# Patient Record
Sex: Female | Born: 1975 | Race: White | Hispanic: Yes | Marital: Single | Smoking: Never smoker
Health system: Southern US, Community
[De-identification: ages and names within clinical notes are randomized; demographics above are authoritative.]

## PROBLEM LIST (undated history)

## (undated) ENCOUNTER — Emergency Department (HOSPITAL_COMMUNITY): Discharge status: Absent without leave | Payer: Self-pay

## (undated) DIAGNOSIS — O24419 Gestational diabetes mellitus in pregnancy, unspecified control: Secondary | ICD-10-CM

## (undated) DIAGNOSIS — Z973 Presence of spectacles and contact lenses: Secondary | ICD-10-CM

## (undated) HISTORY — PX: TUBAL LIGATION: SHX77

## (undated) HISTORY — DX: Gestational diabetes mellitus in pregnancy, unspecified control: O24.419

---
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
## 1998-09-03 ENCOUNTER — Other Ambulatory Visit: Admission: RE | Admit: 1998-09-03 | Discharge: 1998-09-03 | Payer: Self-pay | Admitting: Gynecology

## 1999-01-02 ENCOUNTER — Inpatient Hospital Stay (HOSPITAL_COMMUNITY): Admission: AD | Admit: 1999-01-02 | Discharge: 1999-01-02 | Payer: Self-pay | Admitting: Obstetrics and Gynecology

## 1999-01-02 ENCOUNTER — Encounter: Admission: RE | Admit: 1999-01-02 | Discharge: 1999-04-02 | Payer: Self-pay | Admitting: Gynecology

## 1999-03-19 ENCOUNTER — Inpatient Hospital Stay (HOSPITAL_COMMUNITY): Admission: AD | Admit: 1999-03-19 | Discharge: 1999-03-21 | Payer: Self-pay | Admitting: Gynecology

## 1999-05-06 ENCOUNTER — Other Ambulatory Visit: Admission: RE | Admit: 1999-05-06 | Discharge: 1999-05-06 | Payer: Self-pay | Admitting: Gynecology

## 2001-02-13 ENCOUNTER — Emergency Department (HOSPITAL_COMMUNITY): Admission: EM | Admit: 2001-02-13 | Discharge: 2001-02-13 | Payer: Self-pay | Admitting: *Deleted

## 2002-03-16 ENCOUNTER — Encounter: Admission: RE | Admit: 2002-03-16 | Discharge: 2002-03-16 | Payer: Self-pay | Admitting: Family Medicine

## 2002-03-23 ENCOUNTER — Encounter: Admission: RE | Admit: 2002-03-23 | Discharge: 2002-03-23 | Payer: Self-pay | Admitting: Family Medicine

## 2002-04-23 ENCOUNTER — Encounter: Admission: RE | Admit: 2002-04-23 | Discharge: 2002-04-23 | Payer: Self-pay | Admitting: Family Medicine

## 2002-05-14 ENCOUNTER — Ambulatory Visit (HOSPITAL_COMMUNITY): Admission: RE | Admit: 2002-05-14 | Discharge: 2002-05-14 | Payer: Self-pay | Admitting: Family Medicine

## 2002-05-24 ENCOUNTER — Encounter: Admission: RE | Admit: 2002-05-24 | Discharge: 2002-05-24 | Payer: Self-pay | Admitting: Family Medicine

## 2002-05-25 ENCOUNTER — Encounter: Admission: RE | Admit: 2002-05-25 | Discharge: 2002-05-25 | Payer: Self-pay | Admitting: Family Medicine

## 2002-06-08 ENCOUNTER — Encounter: Admission: RE | Admit: 2002-06-08 | Discharge: 2002-06-08 | Payer: Self-pay | Admitting: Family Medicine

## 2002-06-15 ENCOUNTER — Encounter: Admission: RE | Admit: 2002-06-15 | Discharge: 2002-09-13 | Payer: Self-pay | Admitting: *Deleted

## 2002-06-20 ENCOUNTER — Encounter: Admission: RE | Admit: 2002-06-20 | Discharge: 2002-06-20 | Payer: Self-pay | Admitting: Family Medicine

## 2002-06-27 ENCOUNTER — Ambulatory Visit (HOSPITAL_COMMUNITY): Admission: RE | Admit: 2002-06-27 | Discharge: 2002-06-27 | Payer: Self-pay | Admitting: Family Medicine

## 2002-07-04 ENCOUNTER — Encounter: Admission: RE | Admit: 2002-07-04 | Discharge: 2002-07-04 | Payer: Self-pay | Admitting: Family Medicine

## 2002-07-18 ENCOUNTER — Encounter: Admission: RE | Admit: 2002-07-18 | Discharge: 2002-07-18 | Payer: Self-pay | Admitting: Family Medicine

## 2002-07-20 ENCOUNTER — Encounter: Admission: RE | Admit: 2002-07-20 | Discharge: 2002-07-20 | Payer: Self-pay | Admitting: Family Medicine

## 2002-07-27 ENCOUNTER — Encounter: Admission: RE | Admit: 2002-07-27 | Discharge: 2002-07-27 | Payer: Self-pay | Admitting: Family Medicine

## 2002-07-30 ENCOUNTER — Encounter: Admission: RE | Admit: 2002-07-30 | Discharge: 2002-07-30 | Payer: Self-pay | Admitting: Family Medicine

## 2002-08-13 ENCOUNTER — Encounter: Admission: RE | Admit: 2002-08-13 | Discharge: 2002-08-13 | Payer: Self-pay | Admitting: Sports Medicine

## 2002-08-13 ENCOUNTER — Ambulatory Visit (HOSPITAL_COMMUNITY): Admission: RE | Admit: 2002-08-13 | Discharge: 2002-08-13 | Payer: Self-pay | Admitting: Family Medicine

## 2002-08-27 ENCOUNTER — Encounter: Admission: RE | Admit: 2002-08-27 | Discharge: 2002-08-27 | Payer: Self-pay | Admitting: Family Medicine

## 2002-09-10 ENCOUNTER — Encounter: Admission: RE | Admit: 2002-09-10 | Discharge: 2002-09-10 | Payer: Self-pay | Admitting: Family Medicine

## 2002-09-21 ENCOUNTER — Encounter: Admission: RE | Admit: 2002-09-21 | Discharge: 2002-09-21 | Payer: Self-pay | Admitting: Family Medicine

## 2002-09-28 ENCOUNTER — Encounter: Admission: RE | Admit: 2002-09-28 | Discharge: 2002-09-28 | Payer: Self-pay | Admitting: Family Medicine

## 2002-09-30 ENCOUNTER — Inpatient Hospital Stay (HOSPITAL_COMMUNITY): Admission: AD | Admit: 2002-09-30 | Discharge: 2002-10-02 | Payer: Self-pay | Admitting: *Deleted

## 2002-10-03 ENCOUNTER — Encounter: Admission: RE | Admit: 2002-10-03 | Discharge: 2002-11-02 | Payer: Self-pay | Admitting: *Deleted

## 2002-11-20 ENCOUNTER — Encounter: Admission: RE | Admit: 2002-11-20 | Discharge: 2002-11-20 | Payer: Self-pay | Admitting: Family Medicine

## 2003-03-28 ENCOUNTER — Encounter: Admission: RE | Admit: 2003-03-28 | Discharge: 2003-03-28 | Payer: Self-pay | Admitting: Family Medicine

## 2003-05-17 ENCOUNTER — Encounter: Admission: RE | Admit: 2003-05-17 | Discharge: 2003-05-17 | Payer: Self-pay | Admitting: Family Medicine

## 2004-07-28 ENCOUNTER — Emergency Department (HOSPITAL_COMMUNITY): Admission: EM | Admit: 2004-07-28 | Discharge: 2004-07-28 | Payer: Self-pay | Admitting: Emergency Medicine

## 2004-12-14 ENCOUNTER — Ambulatory Visit: Payer: Self-pay | Admitting: Family Medicine

## 2005-03-11 ENCOUNTER — Encounter (INDEPENDENT_AMBULATORY_CARE_PROVIDER_SITE_OTHER): Payer: Self-pay | Admitting: *Deleted

## 2005-03-11 LAB — CONVERTED CEMR LAB

## 2005-06-23 ENCOUNTER — Ambulatory Visit: Payer: Self-pay | Admitting: Family Medicine

## 2005-07-16 ENCOUNTER — Ambulatory Visit: Payer: Self-pay | Admitting: Family Medicine

## 2006-12-08 DIAGNOSIS — E669 Obesity, unspecified: Secondary | ICD-10-CM | POA: Insufficient documentation

## 2006-12-09 ENCOUNTER — Encounter (INDEPENDENT_AMBULATORY_CARE_PROVIDER_SITE_OTHER): Payer: Self-pay | Admitting: *Deleted

## 2007-01-09 ENCOUNTER — Ambulatory Visit: Payer: Self-pay | Admitting: Family Medicine

## 2007-01-17 ENCOUNTER — Ambulatory Visit: Payer: Self-pay | Admitting: Family Medicine

## 2007-01-17 ENCOUNTER — Encounter: Payer: Self-pay | Admitting: Family Medicine

## 2007-01-17 LAB — CONVERTED CEMR LAB
AST: 14 units/L (ref 0–37)
BUN: 15 mg/dL (ref 6–23)
Calcium: 9.4 mg/dL (ref 8.4–10.5)
Chloride: 105 meq/L (ref 96–112)
Creatinine, Ser: 0.84 mg/dL (ref 0.40–1.20)
Glucose, Bld: 93 mg/dL (ref 70–99)
HCT: 38.8 %
HDL: 43 mg/dL (ref >39)
Hemoglobin: 13.8 g/dL
RBC: 4.1 M/uL
TSH: 2.415 microintl units/mL (ref 0.350–5.50)
Total CHOL/HDL Ratio: 3.6
Triglycerides: 83 mg/dL (ref <150)
WBC: 7.7 10*3/uL

## 2007-02-28 ENCOUNTER — Ambulatory Visit: Payer: Self-pay | Admitting: Family Medicine

## 2007-04-10 ENCOUNTER — Ambulatory Visit: Payer: Self-pay | Admitting: Sports Medicine

## 2007-04-10 ENCOUNTER — Encounter: Payer: Self-pay | Admitting: Family Medicine

## 2007-04-10 LAB — CONVERTED CEMR LAB
Antibody Screen: NEGATIVE
Basophils Relative: 0 % (ref 0–1)
Eosinophils Absolute: 0.3 10*3/uL (ref 0.0–0.7)
Eosinophils Relative: 4 % (ref 0–5)
HCT: 38.1 % (ref 36.0–46.0)
Hemoglobin: 12.4 g/dL (ref 12.0–15.0)
Lymphs Abs: 2.3 10*3/uL (ref 0.7–3.3)
MCHC: 32.5 g/dL (ref 30.0–36.0)
MCV: 97.7 fL (ref 78.0–100.0)
Monocytes Absolute: 0.6 10*3/uL (ref 0.2–0.7)
Monocytes Relative: 6 % (ref 3–11)
Neutrophils Relative %: 67 % (ref 43–77)
RBC: 3.9 M/uL (ref 3.87–5.11)
Rh Type: POSITIVE

## 2007-05-01 ENCOUNTER — Encounter: Payer: Self-pay | Admitting: Family Medicine

## 2007-05-01 ENCOUNTER — Other Ambulatory Visit: Admission: RE | Admit: 2007-05-01 | Discharge: 2007-05-01 | Payer: Self-pay | Admitting: Family Medicine

## 2007-05-01 ENCOUNTER — Ambulatory Visit: Payer: Self-pay | Admitting: Family Medicine

## 2007-05-01 LAB — CONVERTED CEMR LAB: GC Probe Amp, Genital: NEGATIVE

## 2007-06-01 ENCOUNTER — Ambulatory Visit: Payer: Self-pay | Admitting: Family Medicine

## 2007-06-05 ENCOUNTER — Telehealth (INDEPENDENT_AMBULATORY_CARE_PROVIDER_SITE_OTHER): Payer: Self-pay | Admitting: *Deleted

## 2007-06-05 ENCOUNTER — Ambulatory Visit (HOSPITAL_COMMUNITY): Admission: RE | Admit: 2007-06-05 | Discharge: 2007-06-05 | Payer: Self-pay | Admitting: Family Medicine

## 2007-06-06 ENCOUNTER — Ambulatory Visit: Payer: Self-pay | Admitting: Family Medicine

## 2007-06-14 ENCOUNTER — Encounter: Payer: Self-pay | Admitting: Family Medicine

## 2007-06-16 ENCOUNTER — Ambulatory Visit: Payer: Self-pay | Admitting: Family Medicine

## 2007-06-16 ENCOUNTER — Encounter: Payer: Self-pay | Admitting: Family Medicine

## 2007-06-19 ENCOUNTER — Ambulatory Visit (HOSPITAL_COMMUNITY): Admission: RE | Admit: 2007-06-19 | Discharge: 2007-06-19 | Payer: Self-pay | Admitting: Sports Medicine

## 2007-06-19 ENCOUNTER — Encounter: Payer: Self-pay | Admitting: Family Medicine

## 2007-07-06 ENCOUNTER — Ambulatory Visit: Payer: Self-pay | Admitting: Sports Medicine

## 2007-08-01 ENCOUNTER — Ambulatory Visit: Payer: Self-pay | Admitting: Family Medicine

## 2007-08-04 ENCOUNTER — Encounter: Payer: Self-pay | Admitting: Family Medicine

## 2007-08-04 ENCOUNTER — Ambulatory Visit: Payer: Self-pay | Admitting: Family Medicine

## 2007-08-07 ENCOUNTER — Encounter: Payer: Self-pay | Admitting: Family Medicine

## 2007-08-15 ENCOUNTER — Ambulatory Visit: Payer: Self-pay | Admitting: Family Medicine

## 2007-08-30 ENCOUNTER — Ambulatory Visit: Payer: Self-pay | Admitting: Family Medicine

## 2007-09-15 ENCOUNTER — Ambulatory Visit: Payer: Self-pay | Admitting: Family Medicine

## 2007-09-28 ENCOUNTER — Ambulatory Visit: Payer: Self-pay | Admitting: Family Medicine

## 2007-10-10 ENCOUNTER — Encounter: Payer: Self-pay | Admitting: Family Medicine

## 2007-10-10 ENCOUNTER — Ambulatory Visit: Payer: Self-pay | Admitting: Family Medicine

## 2007-10-10 LAB — CONVERTED CEMR LAB: Chlamydia, DNA Probe: NEGATIVE

## 2007-10-11 ENCOUNTER — Encounter: Payer: Self-pay | Admitting: Family Medicine

## 2007-10-12 HISTORY — PX: TUBAL LIGATION: SHX77

## 2007-10-18 ENCOUNTER — Ambulatory Visit: Payer: Self-pay | Admitting: Family Medicine

## 2007-10-22 ENCOUNTER — Inpatient Hospital Stay (HOSPITAL_COMMUNITY): Admission: AD | Admit: 2007-10-22 | Discharge: 2007-10-22 | Payer: Self-pay | Admitting: Gynecology

## 2007-10-22 ENCOUNTER — Inpatient Hospital Stay (HOSPITAL_COMMUNITY): Admission: AD | Admit: 2007-10-22 | Discharge: 2007-10-24 | Payer: Self-pay | Admitting: Gynecology

## 2007-10-22 ENCOUNTER — Ambulatory Visit: Payer: Self-pay | Admitting: Obstetrics and Gynecology

## 2007-11-07 ENCOUNTER — Telehealth: Payer: Self-pay | Admitting: *Deleted

## 2007-11-18 IMAGING — US US OB COMP +14 WK
1 series · 5 of 5 positions shown · non-contrast
Comparison: none

OBSTETRICAL ULTRASOUND:

 This ultrasound exam was performed in the [HOSPITAL] Ultrasound Department.  The OB US report was generated in the AS system, and faxed to the ordering physician.  This report is also available in [REDACTED] PACS.

[Series 1: us ob comp +14 wk · 0.20mm/px · 5 of 5 slices shown]
[im 1/5]
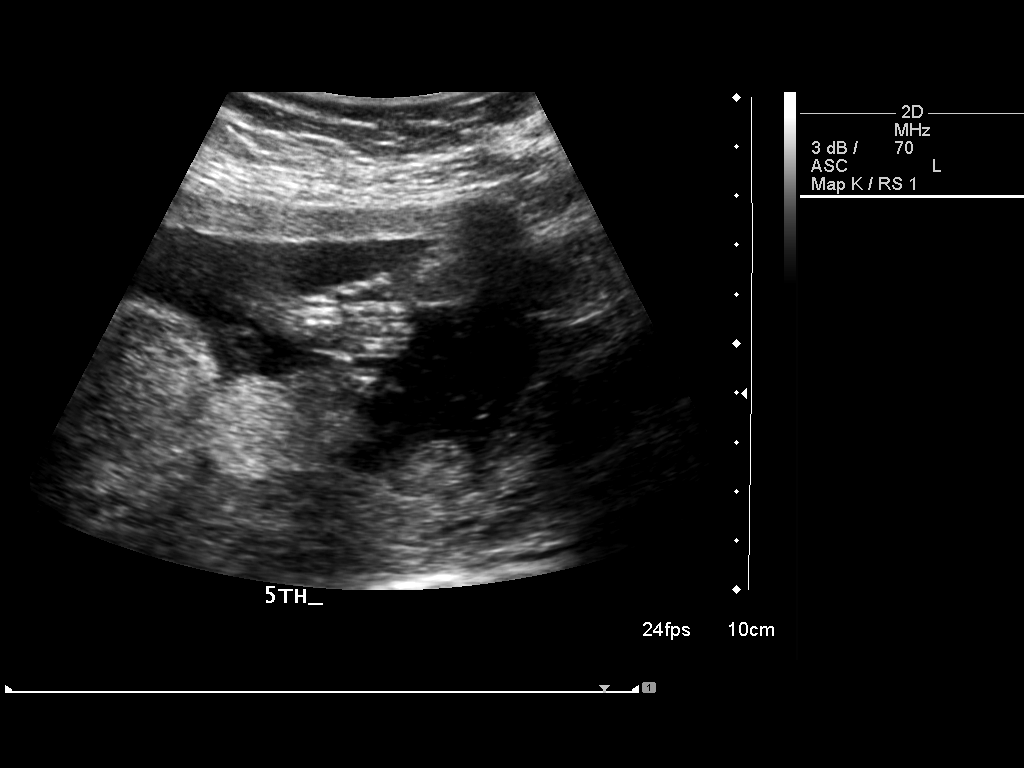
[im 2/5]
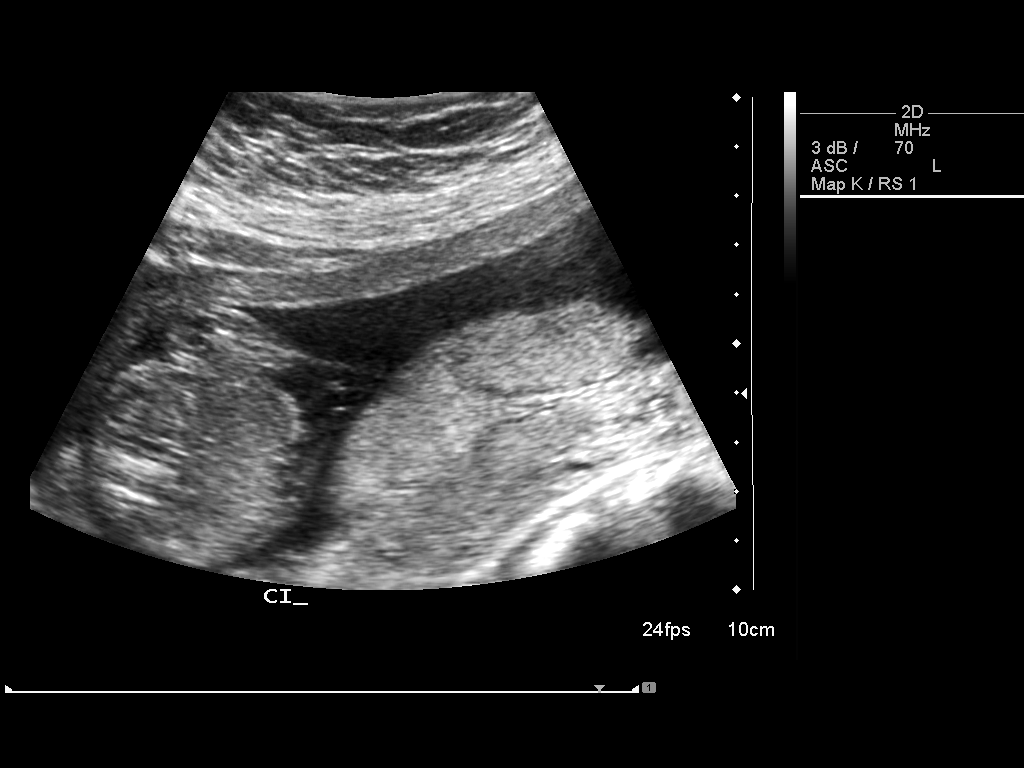
[im 3/5]
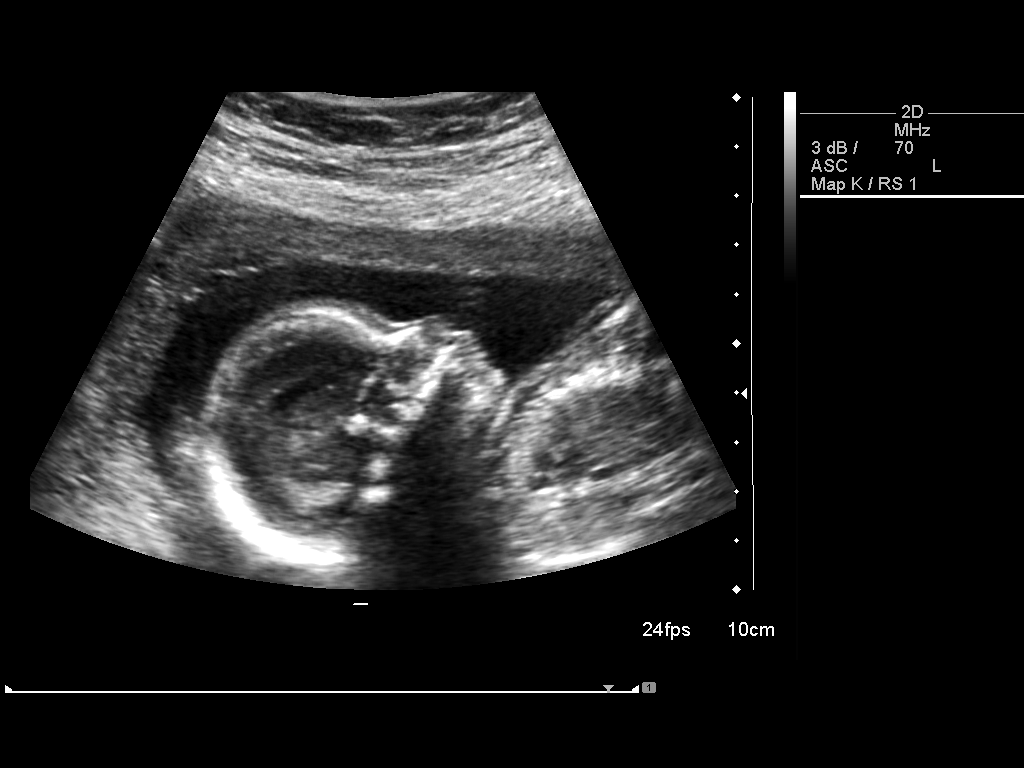
[im 4/5]
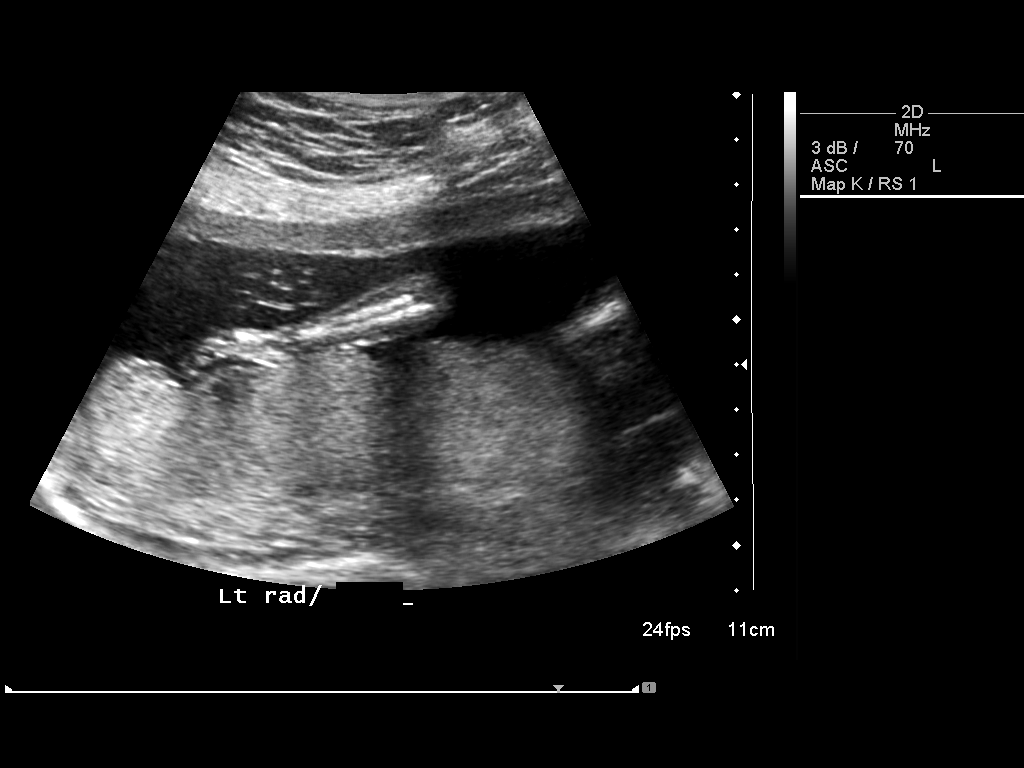
[im 5/5]
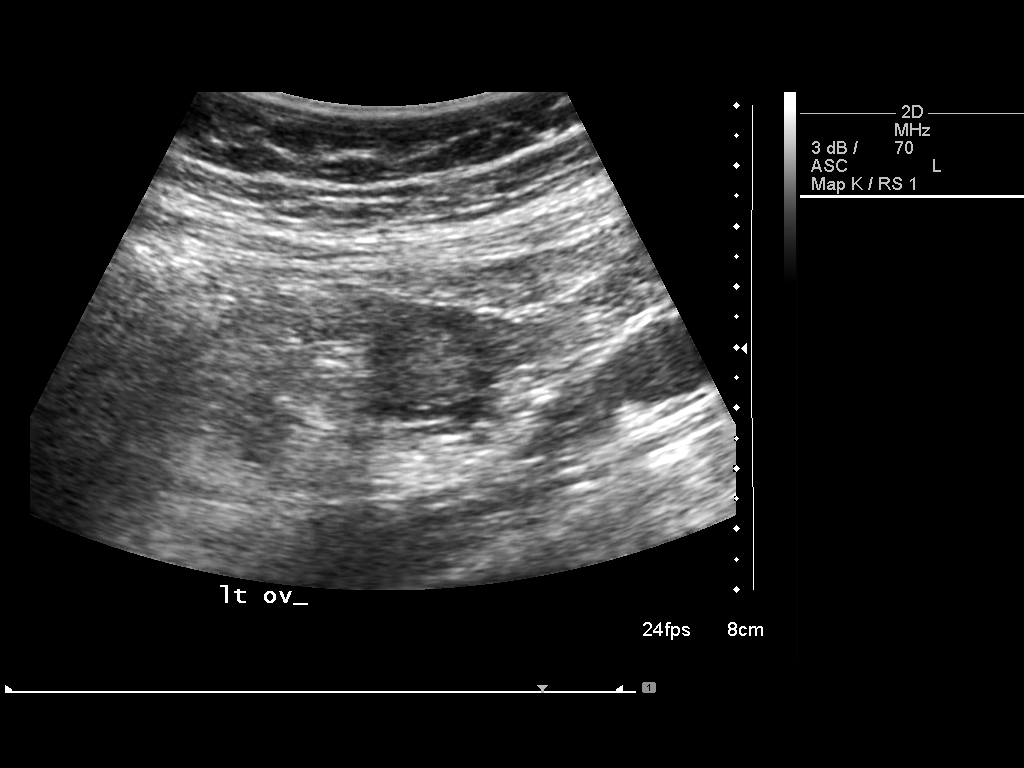

[5 of 5 positions shown; findings below may reference images not displayed]

IMPRESSION: See AS Obstetric US report.

## 2007-12-04 ENCOUNTER — Ambulatory Visit: Payer: Self-pay | Admitting: Sports Medicine

## 2008-01-17 ENCOUNTER — Encounter: Payer: Self-pay | Admitting: Family Medicine

## 2008-02-05 ENCOUNTER — Ambulatory Visit: Payer: Self-pay | Admitting: Family Medicine

## 2008-04-15 ENCOUNTER — Encounter: Payer: Self-pay | Admitting: Family Medicine

## 2008-04-15 ENCOUNTER — Ambulatory Visit: Payer: Self-pay | Admitting: Family Medicine

## 2008-04-19 ENCOUNTER — Encounter: Payer: Self-pay | Admitting: Family Medicine

## 2008-04-19 LAB — CONVERTED CEMR LAB: Pap Smear: NORMAL

## 2008-10-28 ENCOUNTER — Ambulatory Visit: Payer: Self-pay | Admitting: Family Medicine

## 2010-05-19 ENCOUNTER — Ambulatory Visit: Payer: Self-pay | Admitting: Family Medicine

## 2010-05-19 ENCOUNTER — Encounter: Payer: Self-pay | Admitting: Family Medicine

## 2010-05-19 LAB — CONVERTED CEMR LAB
Albumin: 4.4 g/dL (ref 3.5–5.2)
Alkaline Phosphatase: 91 units/L (ref 39–117)
BUN: 17 mg/dL (ref 6–23)
CO2: 22 meq/L (ref 19–32)
Glucose, Bld: 93 mg/dL (ref 70–99)
Hemoglobin: 13.3 g/dL (ref 12.0–15.0)
MCHC: 33.2 g/dL (ref 30.0–36.0)
MCV: 98 fL (ref 78.0–100.0)
Potassium: 4.1 meq/L (ref 3.5–5.3)
RBC: 4.09 M/uL (ref 3.87–5.11)
Sodium: 137 meq/L (ref 135–145)
Total Bilirubin: 0.6 mg/dL (ref 0.3–1.2)
Total Protein: 7.3 g/dL (ref 6.0–8.3)
WBC: 7.4 10*3/uL (ref 4.0–10.5)

## 2010-05-25 ENCOUNTER — Encounter: Payer: Self-pay | Admitting: Family Medicine

## 2010-07-16 ENCOUNTER — Ambulatory Visit: Payer: Self-pay | Admitting: Family Medicine

## 2010-09-16 ENCOUNTER — Ambulatory Visit: Payer: Self-pay | Admitting: Family Medicine

## 2010-11-12 NOTE — Assessment & Plan Note (Signed)
Summary: physical/pap/bmc   Vital Signs:  Patient profile:   35 year old female Height:      65 inches Weight:      186 pounds BMI:     31.06 Pulse rate:   64 / minute BP sitting:   109 / 76  (left arm)  Vitals Entered By: Arlyss Repress CMA, 06-05-10 9:04 AM) CC: physical with pap. meet new doctor. Is Patient Diabetic? No Pain Assessment Patient in pain? no        Primary Care Provider:  Cat Ta MD  CC:  physical with pap. meet new doctor.Marland Kitchen  History of Present Illness: 35 y/o F here for complete physical   Contraception: BTL 03/08/08  GYN: LMP Julny 12, usually lasts 4-5 days.          Habits & Providers  Alcohol-Tobacco-Diet     Alcohol drinks/day: 0     Tobacco Status: never  Exercise-Depression-Behavior     Does Patient Exercise: no     Exercise Counseling: to improve exercise regimen     STD Risk: current     STD Risk Counseling: to avoid increased STD risk     Contraception Counseling: questions answered     Drug Use: never     Seat Belt Use: always  Current Medications (verified): 1)  None  Allergies (verified): No Known Drug Allergies  Past History:  Past Medical History: Last updated: 05/01/2007 G3P3003, gestational diabetes 648.8, h/o preeclampsia  Family History: Last updated: 06-05-10 Mother died at 24 CAD No breast cancer Mat grandma died in her 68s of GYN cancer  Risk Factors: Alcohol Use: 0 (June 05, 2010) Exercise: no (06/05/2010)  Risk Factors: Smoking Status: never (2010-06-05)  Past Surgical History: -Unilateral salpingoophorectomy for ovarian cyst -BTL Mar 08, 2008  Family History: Mother died at 55 CAD No breast cancer Mat grandma died in her 74s of GYN cancer  Social History: Originally from [place], divorced prior husband (has two sons from that marriage) Past husband from [place] (has a son '03 from that marriage) Now cohabiting with FOB, [place]. Children [facility] [place], Miquel Leanor Rubenstein Stay at home mother.  Denies tobacco, etoh or illicit drugs use.Does Patient Exercise:  no Seat Belt Use:  always Drug Use:  never STD Risk:  current  Review of Systems General:  Denies chills and fever. Eyes:  Denies blurring. CV:  Denies chest pain or discomfort and difficulty breathing at night. Resp:  Denies cough, shortness of breath, and wheezing. GI:  Denies abdominal pain, bloody stools, and change in bowel habits. GU:  Denies abnormal vaginal bleeding, discharge, and dysuria. MS:  Denies joint pain, joint redness, and joint swelling.  Physical Exam  General:  Well-developed,well-nourished,in no acute distress; alert,appropriate and cooperative throughout examination. vitals reviewed.  Head:  normocephalic and atraumatic.   Eyes:  No corneal or conjunctival inflammation noted. EOMI. Perrla. Ears:  External ear exam shows no significant lesions or deformities.  Otoscopic examination reveals clear canals, tympanic membranes are intact bilaterally without bulging, retraction, inflammation or discharge. Hearing is grossly normal bilaterally. Mouth:  Oral mucosa and oropharynx without lesions or exudates.  Teeth in good repair. Neck:  No deformities, masses, or tenderness noted. Lungs:  Normal respiratory effort, chest expands symmetrically. Lungs are clear to auscultation, no crackles or wheezes. Heart:  Normal rate and regular rhythm. S1 and S2 normal without gallop, murmur, click, rub or other extra sounds. Abdomen:  Bowel sounds positive,abdomen soft and non-tender without masses, organomegaly  or hernias noted. Genitalia:  Normal introitus for age, no external lesions, no vaginal discharge, mucosa pink and moist, no vaginal or cervical lesions, no vaginal atrophy, no friaility or hemorrhage, normal uterus size and position, no adnexal masses or tenderness Msk:  No deformity or scoliosis noted of thoracic or lumbar spine.   Pulses:  R and L  carotid,radial,,dorsalis pedis l pulses are full and equal bilaterally Extremities:  No clubbing, cyanosis, edema, or deformity noted with normal full range of motion of all joints.   Neurologic:  No cranial nerve deficits noted. Station and gait are normal. Plantar reflexes are down-going bilaterally. DTRs are symmetrical throughout. Sensory, motor and coordinative functions appear intact. Skin:  Intact without suspicious lesions or rashes Cervical Nodes:  No lymphadenopathy noted Axillary Nodes:  No palpable lymphadenopathy Inguinal Nodes:  No significant adenopathy Psych:  Oriented X3.     Impression & Recommendations:  Problem # 1:  WELL ADULT EXAM (ICD-V70.0) Assessment Unchanged Exam wnl.  pap done today. Besides concerns with husband, she is doing well.  Will check cbc, cmet.    Orders: Comp Met-[facility] [id number]) CBC-[facility] (29562) [facility] - Est  18-39 yrs ([address])  Problem # 2:  SEXUALLY TRANSMITTED DISEASE, EXPOSURE TO (ICD-V01.6) Pt is concerned about husband who works out of town.  Would like STD check.  Orders: HIV-[facility] ([id number]) RPR-[facility] [phone]) GC/Chlamydia-[facility] ([id number]) [facility] - Est  18-39 yrs [phone])  Other Orders: Pap Smear-[facility] ([id number])    Prevention & Chronic Care Immunizations   Influenza vaccine: Fluvax Non-MCR  (10/28/2008)   Influenza vaccine due: 07/05/2008    Tetanus booster: 04/15/2008: given   Tetanus booster due: 04/15/2018    Pneumococcal vaccine: Not documented  Other Screening   Pap smear: Normal  (04/19/2008)   Pap smear due: 04/2009   Smoking status: never  (05/19/2010)  Lipids   Total Cholesterol: 156  (01/17/2007)   LDL: 96  (01/17/2007)   LDL Direct: Not documented   HDL: 43  (01/17/2007)   Triglycerides: 83  (01/17/2007)

## 2010-11-12 NOTE — Letter (Signed)
Summary: Results Follow-up Letter  [facility]  [address]   [place], [place] [address]   Phone: [phone]  Fax: [phone]    05/25/2010  [address] [place], [place]  [address]  Dear Ms. Mary Hebert,   The following are the results of your recent test(s):  Test     Result     Pap Smear    Normal  Gonorrhea         Negative (Normal)  Chlamydia         Negative (Normal)  HIV      Non-reactive (Normal)  Syphilis     Non-reactive (Normal)  CBC      Normal, not anemic  Electrolytes  Normal  Kidney function   Normal  Liver function  Normal    Sincerely,  Cat Ta MD [facility]           Appended Document: Results Follow-up Letter mailed

## 2010-11-12 NOTE — Assessment & Plan Note (Signed)
Summary: rash    Vital Signs:  Patient profile:   35 year old female Weight:      188.7 pounds Temp:     98.1 degrees F oral Pulse rate:   68 / minute BP sitting:   118 / 74  (right arm)  Vitals Entered By: Renato Battles slade,cma CC: rash around mouth x 2 weeks. denies pain or itching. Is Patient Diabetic? No Pain Assessment Patient in pain? no        Primary Care Provider:  Alek Borges MD  CC:  rash around mouth x 2 weeks. denies pain or itching.Marland Kitchen  History of Present Illness: 35 y/o healthy F here for facial rash x 2 wks  RASH Location: around mouth Onset: 2 wks ago Description: darkened skin around mouth and chin Modifying factors:   Symptoms Pruritis: no Tenderness: no New medications/antibiotics: no Tick/insect/pet exposure: no Recent travel: no New detergent, new clothing, or other topical exposure: no  Red Flags Feeling ill: no Fever: no Mouth lesions: no Facial/tongue swelling/difficulty breathing: no Diabetic or immunocompromise: no    Habits & Providers  Alcohol-Tobacco-Diet     Tobacco Status: never     Passive Smoke Exposure: no  Allergies (verified): No Known Drug Allergies  Social History: Passive Smoke Exposure:  no  Review of Systems       per hpi   Physical Exam  General:  Well-developed,well-nourished,in no acute distress; alert,appropriate and cooperative throughout examination. Vitals reviewed.  Head:  Normocephalic and atraumatic without obvious abnormalities. No apparent alopecia or balding. See skin exam  Skin:  Mouth: Hyperpigmented, macular rash, indistinct borders around bilateral corners of lips and distributed vertically downward towards chin.  Aslo some hyperpigmented areas under lower lip, by chin.  No erythema, redness, swelling, vesicles, pustules   Impression & Recommendations:  Problem # 1:  SKIN RASH (ICD-782.1) Assessment New  Hyperpigmented macular rash perioral areas.  Lilkely tinea versicolor.  Will treat  with ketoconazole.  Can use Selsun Blue if too expensive.  Pt to f/u in 3-4 wks.    Her updated medication list for this problem includes:    Ketoconazole 2 % Crea (Ketoconazole) .Marland Kitchen... Apply to affected areas around mouth daily for 2 weeks.  dispense qs.  Orders: [facility]- Est Level  3 ([address])  Complete Medication List: 1)  Ketoconazole 2 % Crea (Ketoconazole) .... Apply to affected areas around mouth daily for 2 weeks.  dispense qs.  Patient Instructions: 1)  Please schedule a follow-up appointment in 3-4 weeks if rash not better. 2)  Try ketoconazole 2% cream once a day on skin x 2 wks. 3)  If this is too expensive, you can try Selsun Blue shampoo.   Prescriptions: KETOCONAZOLE 2 % CREA (KETOCONAZOLE) Apply to affected areas around mouth daily for 2 weeks.  Dispense qs.  #1 x 0   Entered and Authorized by:   Angeline Slim MD   Signed by:   Angeline Slim MD on 09/16/2010   Method used:   Electronically to        [facility]. [phone]* (retail)       [address]       [place], [place]  [address]       Ph: [phone]       Fax: [phone]   RxID:   [id number]    Orders Added: 1)  [facility]- Est Level  3 [[address]]

## 2010-11-12 NOTE — Assessment & Plan Note (Signed)
Summary: flu shot,tcb  Nurse Visit   Vital Signs:  Patient profile:   35 year old female Temp:     98.8 degrees F  Vitals Entered By: Theresia Lo RN (July 16, 2010 4:05 PM)  Allergies: No Known Drug Allergies  Immunizations Administered:  Influenza Vaccine # 1:    Vaccine Type: Fluvax 3+    Site: left deltoid    Mfr: GlaxoSmithKline    Dose: 0.5 ml    Route: IM    Given by: Theresia Lo RN    Exp. Date: 04/07/2011    Lot #: [id number]    VIS given: 05/05/10 version given July 16, 2010.  Flu Vaccine Consent Questions:    Do you have a history of severe allergic reactions to this vaccine? no    Any prior history of allergic reactions to egg and/or gelatin? no    Do you have a sensitivity to the preservative Thimersol? no    Do you have a past history of Guillan-Barre Syndrome? no    Do you currently have an acute febrile illness? no    Have you ever had a severe reaction to latex? no    Vaccine information given and explained to patient? yes    Are you currently pregnant? no  Orders Added: 1)  Flu Vaccine 38yrs + [90[address]] 2)  Admin 1st Vaccine [57846]

## 2011-02-23 NOTE — Op Note (Signed)
[name], Mary Hebert NO.:  [id number]   MEDICAL RECORD NO.:  [id number]          PATIENT TYPE:  INP   LOCATION:  9136                          FACILITY:  [facility]   PHYSICIAN:  Lesly Dukes, M.D. 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   POSTOPERATIVE DIAGNOSIS:  A 35 year old para 22 female with undesired  fertility.   PROCEDURE:  Postpartum BTL on the left side only.  The patient's right  side is surgically absent.   SURGEON:  Lesly Dukes, M.D.   ASSISTANT:  None.   ANESTHESIA:  General anesthesia.   SPECIMENS:  None.   ESTIMATED BLOOD LOSS:  Minimal.   COMPLICATIONS:  None.   FINDINGS:  Grossly normal postpartum uterus with surgically absent right  adnexa and right fallopian tube.  Left fallopian tube appears normal.   DESCRIPTION OF PROCEDURE:  After informed consent was obtained, patient  was taken to the operating room where general anesthesia was induced.  The patient was prepared and draped in normal sterile fashion and an  umbilical skin incision was made with a scalpel and carried down to the  fascia.  The fascia was incised and extended bilaterally with the Mayo  scissors.  The peritoneum was entered bluntly.  Retractors placed into  the abdomen.  The left fallopian tube was found easily and followed out  to its fimbriated end.  A fallopian tube was placed across the fallopian  tube and there was good hemostasis.  The fallopian tube was returned to  the abdomen.  We spent 10 minutes making sure that the right adnexa was  surgically absent.  The patient had remembered having an oophorectomy at  age 50 but did not remember which side.  There was no evidence of a  tube.  We did find the round ligament.  The fascia was then closed with  0 Vicryl in a running fashion.  The  subcutaneous tissue was closed with  4-0 Vicryl in subcuticular fashion.  Patient tolerated the procedure  well.  Sponge, needle and instrument counts were correct x2 and patient  went to the recovery room in stable condition.     Lesly Dukes, M.D.  Electronically Signed    KHL/MEDQ  D:  10/23/2007  T:  10/23/2007  Job:  [id number]

## 2011-06-30 LAB — CBC
HCT: 37.2
Hemoglobin: 11.9 — ABNORMAL LOW
Hemoglobin: 13.3
MCHC: 35.8
MCV: 96.8
Platelets: 161
RBC: 3.42 — ABNORMAL LOW
RBC: 3.85 — ABNORMAL LOW
RDW: 13.4
RDW: 13.4
WBC: 13.8 — ABNORMAL HIGH

## 2011-06-30 LAB — SYPHILIS: RPR W/REFLEX TO RPR TITER AND TREPONEMAL ANTIBODIES, TRADITIONAL SCREENING AND DIAGNOSIS ALGORITHM: RPR Ser Ql: NONREACTIVE

## 2011-09-06 ENCOUNTER — Encounter: Payer: Self-pay | Admitting: Family Medicine

## 2011-09-06 ENCOUNTER — Ambulatory Visit (INDEPENDENT_AMBULATORY_CARE_PROVIDER_SITE_OTHER): Payer: Self-pay | Admitting: Family Medicine

## 2011-09-06 DIAGNOSIS — E669 Obesity, unspecified: Secondary | ICD-10-CM

## 2011-09-06 DIAGNOSIS — Z01419 Encounter for gynecological examination (general) (routine) without abnormal findings: Secondary | ICD-10-CM | POA: Insufficient documentation

## 2011-09-06 DIAGNOSIS — Z0000 Encounter for general adult medical examination without abnormal findings: Secondary | ICD-10-CM

## 2011-09-06 LAB — CBC
HCT: 39.8 % (ref 36.0–46.0)
Hemoglobin: 13.5 g/dL (ref 12.0–15.0)
MCH: 33 pg (ref 26.0–34.0)
MCHC: 33.9 g/dL (ref 30.0–36.0)
MCV: 97.3 fL (ref 78.0–100.0)
RDW: 13.7 % (ref 11.5–15.5)

## 2011-09-06 NOTE — Assessment & Plan Note (Addendum)
Counseled on weight loss and alcohol consumption.  Encouraged pt to cut down on beer.  Pt wanted to discuss lower calorie options to regular beer.  Again emphasized DECREASING amount of alcohol rather than substituting.  Has never had abnormal pap, last pap 05/2010.  No family h/o breast cancer, so will start mammogram at 80.  Pt not fasting but overweight so will check LDL, CBC, CMET for baseline labs and liver function.  Patient would like dental referral for general cleaning and cavity filling.

## 2011-09-06 NOTE — Progress Notes (Signed)
Subjective:     Mary Hebert is a 35 y.o. female and is here for a comprehensive physical exam. The patient reports no problems.  Concerns include:  Weight: pt knows that she is over weight. She does not enjoy doing exercise and thus has not started. She states that she is moving in the next few weeks and her new apartment complex will have a gym. She states that she drinks mostly water sometimes soda. She does endorse drinking beer once a week to once every other week in a social setting. She states that she drinks at least 12 beers on these occasions. She states that she often eats rice and possible. She knows that she does not eat enough vegetables or fruit. She occasionally eats fried food or fast food. She sometimes eats breakfast. She does not have any food that she eats regularly, just eats whatever is in the house.  Dental referral: Patient cannot murmur the last time that she had her T. cleaned. She also complains of some dental caries described as "black holes" in her mouth. Occasional pain with drinking cold fluids. She reports no fevers, chills, redness, swelling, or warmth over her jaw line or teeth. She has not had any pus or drainage from her teeth.  Patient would also like to know if she needs to have any lab work done. She's not concerned about STDs. She has one partner with whom she is sexually active. She has not had any vaginal discharge. She's had a tubal ligation.  She does not smoke. She does drink alcohol, beer, 2-4 times per month. She has no other drugs.  History   Social History  . Marital Status: Single    Spouse Name: N/A    Number of Children: N/A  . Years of Education: N/A   Occupational History  . Not on file.   Social History Main Topics  . Smoking status: Never Smoker   . Smokeless tobacco: Not on file  . Alcohol Use: 7.2 oz/week    12 Cans of beer per week     drinks socially, qweek to [employer], usually drnks >12 beers per social encounter   . Drug  Use: No  . Sexually Active: Yes    Birth Control/ Protection: Surgical   Other Topics Concern  . Not on file   Social History Narrative  . No narrative on file   Health Maintenance  Topic Date Due  . Influenza Vaccine  07/12/2011  . Pap Smear  05/19/2013  . Tetanus/tdap  04/15/2018    The following portions of the patient's history were reviewed and updated as appropriate: allergies, current medications, past family history, past medical history, past social history, past surgical history and problem list.  Review of Systems Constitutional: negative for anorexia, fatigue, fevers and weight loss Eyes: negative for visual disturbance Ears, nose, mouth, throat, and face: negative for earaches, facial trauma, hoarseness, nasal congestion and sore throat Respiratory: negative for asthma, cough, dyspnea on exertion and wheezing Cardiovascular: negative for chest pain, chest pressure/discomfort, claudication, dyspnea, exertional chest pressure/discomfort, lower extremity edema, near-syncope, palpitations and syncope Gastrointestinal: negative for abdominal pain, change in bowel habits, constipation, diarrhea and nausea Genitourinary:negative for dysuria and urinary incontinence Hematologic/lymphatic: negative for bleeding, easy bruising and lymphadenopathy Musculoskeletal:negative for arthralgias, back pain, muscle weakness and myalgias Neurological: negative for coordination problems, dizziness, gait problems, headaches and memory problems Behavioral/Psych: negative for anxiety and depression   Objective:    BP 130/82  Pulse 70  Ht 5'  4.25" (1.632 m)  Wt 196 lb (88.905 kg)  BMI 33.38 kg/m2 General appearance: alert, cooperative, appears stated age, no distress and mildly obese Head: Normocephalic, without obvious abnormality, atraumatic Eyes: conjunctivae/corneas clear. PERRL, EOM's intact. Fundi benign. Ears: normal TM's and external ear canals both ears Nose: Nares normal. Septum  midline. Mucosa normal. No drainage or sinus tenderness. Throat: lips, mucosa, and tongue normal; teeth and gums normal Mouth: obvious dental carries bilateral lower and left upper teeth, no surrounding erythema, no exudate or pus, no warmth or swelling Neck: no adenopathy, no carotid bruit, supple, symmetrical, trachea midline and thyroid not enlarged, symmetric, no tenderness/mass/nodules Lungs: clear to auscultation bilaterally Heart: regular rate and rhythm, S1, S2 normal, no murmur, click, rub or gallop Abdomen: soft, non-tender; bowel sounds normal; no masses,  no organomegaly Extremities: extremities normal, atraumatic, no cyanosis or edema Skin: Skin color, texture, turgor normal. No rashes or lesions Neurologic: Grossly normal    Assessment:    Healthy female exam. Encouraged dieting and exercise for weight loss.     Plan:     See After Visit Summary for Counseling Recommendations

## 2011-09-06 NOTE — Patient Instructions (Addendum)
It was great to meet you today! For weight loss, you have told me that it is possible for you to walk 20 minutes per day, 5 days a week (ideally, the goal will be 30 minutes total). You also think that drinking less regular beer, trying light beer, or using club soda or diet soda and vodka would help with weight loss.  I think the BEST option is drinking LESS alcohol overall!!!!! We are drawing some blood work today.  I will call when your letter with the results is ready to be picked up. We gave you your flu shot today. Come back in 3 months so we can see how the weight loss is going.

## 2011-09-06 NOTE — Assessment & Plan Note (Addendum)
Pt's BMI is 33.  Pt aware that she needs to lose weight. Has gained 18 lb in 1 year. F/u 3 month.   Her weight loss suggestions:  -walk 20 minutes per day, 5 days a week (ideally, the goal will be 30 minutes total).  -drinking less regular beer, trying light beer, or using club soda or diet soda and vodka (again encouraged pt to drink LESS alcohol overall rather than substituting)

## 2011-09-07 ENCOUNTER — Encounter: Payer: Self-pay | Admitting: Family Medicine

## 2011-09-07 LAB — COMPREHENSIVE METABOLIC PANEL
AST: 15 U/L (ref 0–37)
Albumin: 4.3 g/dL (ref 3.5–5.2)
Alkaline Phosphatase: 89 U/L (ref 39–117)
BUN: 17 mg/dL (ref 6–23)
Creat: 0.72 mg/dL (ref 0.50–1.10)
Glucose, Bld: 91 mg/dL (ref 70–99)

## 2012-07-14 ENCOUNTER — Ambulatory Visit (INDEPENDENT_AMBULATORY_CARE_PROVIDER_SITE_OTHER): Payer: Self-pay | Admitting: *Deleted

## 2012-07-14 DIAGNOSIS — Z23 Encounter for immunization: Secondary | ICD-10-CM

## 2012-07-17 ENCOUNTER — Ambulatory Visit: Payer: Self-pay

## 2014-02-08 ENCOUNTER — Emergency Department (INDEPENDENT_AMBULATORY_CARE_PROVIDER_SITE_OTHER)
Admission: EM | Admit: 2014-02-08 | Discharge: 2014-02-08 | Disposition: A | Discharge status: Home / Self Care | Payer: Self-pay | Source: Home / Self Care | Attending: Family Medicine | Admitting: Family Medicine

## 2014-02-08 ENCOUNTER — Encounter (HOSPITAL_COMMUNITY): Payer: Self-pay | Admitting: Emergency Medicine

## 2014-02-08 DIAGNOSIS — J40 Bronchitis, not specified as acute or chronic: Secondary | ICD-10-CM

## 2014-02-08 MED ORDER — PREDNISONE 10 MG PO TABS: 30.0000 mg | ORAL_TABLET | Freq: Every day | ORAL | Status: DC

## 2014-02-08 MED ORDER — IPRATROPIUM-ALBUTEROL 0.5-2.5 (3) MG/3ML IN SOLN
RESPIRATORY_TRACT | Status: AC
  Filled 2014-02-08: qty 3

## 2014-02-08 MED ORDER — IPRATROPIUM-ALBUTEROL 0.5-2.5 (3) MG/3ML IN SOLN
3.0000 mL | Freq: Once | RESPIRATORY_TRACT | Status: AC
  Administered 2014-02-08: 3 mL via RESPIRATORY_TRACT

## 2014-02-08 MED ORDER — TRAMADOL HCL 50 MG PO TABS: 50.0000 mg | ORAL_TABLET | Freq: Every evening | ORAL | Status: DC | PRN

## 2014-02-08 MED ORDER — ALBUTEROL SULFATE HFA 108 (90 BASE) MCG/ACT IN AERS
INHALATION_SPRAY | RESPIRATORY_TRACT | Status: AC
  Filled 2014-02-08: qty 6.7

## 2014-02-08 MED ORDER — ALBUTEROL SULFATE HFA 108 (90 BASE) MCG/ACT IN AERS
2.0000 | INHALATION_SPRAY | RESPIRATORY_TRACT | Status: DC | PRN
  Administered 2014-02-08: 2 via RESPIRATORY_TRACT

## 2014-02-08 MED ORDER — AZITHROMYCIN 250 MG PO TABS: 250.0000 mg | ORAL_TABLET | Freq: Every day | ORAL | Status: DC

## 2014-02-08 NOTE — Discharge Instructions (Signed)
Thank you for coming in today. Use the albuterol inhaler a needed.  Call or go to the emergency room if you get worse, have trouble breathing, have chest pains, or palpitations.  Take the medications as directed.    Bronchitis Bronchitis is inflammation of the airways that extend from the windpipe into the lungs (bronchi). The inflammation often causes mucus to develop, which leads to a cough. If the inflammation becomes severe, it may cause shortness of breath. CAUSES  Bronchitis may be caused by:   Viral infections.   Bacteria.   Cigarette smoke.   Allergens, pollutants, and other irritants.  SIGNS AND SYMPTOMS  The most common symptom of bronchitis is a frequent cough that produces mucus. Other symptoms include:  Fever.   Body aches.   Chest congestion.   Chills.   Shortness of breath.   Sore throat.  DIAGNOSIS  Bronchitis is usually diagnosed through a medical history and physical exam. Tests, such as chest X-rays, are sometimes done to rule out other conditions.  TREATMENT  You may need to avoid contact with whatever caused the problem (smoking, for example). Medicines are sometimes needed. These may include:  Antibiotics. These may be prescribed if the condition is caused by bacteria.  Cough suppressants. These may be prescribed for relief of cough symptoms.   Inhaled medicines. These may be prescribed to help open your airways and make it easier for you to breathe.   Steroid medicines. These may be prescribed for those with recurrent (chronic) bronchitis. HOME CARE INSTRUCTIONS  Get plenty of rest.   Drink enough fluids to keep your urine clear or pale yellow (unless you have a medical condition that requires fluid restriction). Increasing fluids may help thin your secretions and will prevent dehydration.   Only take over-the-counter or prescription medicines as directed by your health care provider.  Only take antibiotics as directed. Make  sure you finish them even if you start to feel better.  Avoid secondhand smoke, irritating chemicals, and strong fumes. These will make bronchitis worse. If you are a smoker, quit smoking. Consider using nicotine gum or skin patches to help control withdrawal symptoms. Quitting smoking will help your lungs heal faster.   Put a cool-mist humidifier in your bedroom at night to moisten the air. This may help loosen mucus. Change the water in the humidifier daily. You can also run the hot water in your shower and sit in the bathroom with the door closed for 5 10 minutes.   Follow up with your health care provider as directed.   Wash your hands frequently to avoid catching bronchitis again or spreading an infection to others.  SEEK MEDICAL CARE IF: Your symptoms do not improve after 1 week of treatment.  SEEK IMMEDIATE MEDICAL CARE IF:  Your fever increases.  You have chills.   You have chest pain.   You have worsening shortness of breath.   You have bloody sputum.  You faint.  You have lightheadedness.  You have a severe headache.   You vomit repeatedly. MAKE SURE YOU:   Understand these instructions.  Will watch your condition.  Will get help right away if you are not doing well or get worse. Document Released: 09/27/2005 Document Revised: 07/18/2013 Document Reviewed: 05/22/2013 [facility] Patient Information 2014 [employer], [employer].

## 2014-02-08 NOTE — ED Notes (Signed)
Pt c/o cold sx onset 3 weeks Reports sx started out as allergies Sx today include: cough, SOB, runny nose, wheezing, chest tightness Denies f/v/n/d Taking OTC cold meds w/no relief Alert w/no signs of acute distress.

## 2014-02-08 NOTE — ED Provider Notes (Signed)
Mary Hebert is a 38 y.o. female who presents to Urgent Care today for cough wheezing shortness of breath and chest tightness. Patient has 3 weeks of runny nose and allergy symptoms. A few days ago she developed coughing wheezing and shortness of breath. She denies any fevers chills nausea vomiting or diarrhea. She has tried Robitussin, Advil, and Zyrtec which have helped a bit. She feels well otherwise. No chest pains or palpitations.   Past Medical History  Diagnosis Date  . Gestational diabetes mellitus     with 2nd child, did not have with children #3 and 4   History  Substance Use Topics  . Smoking status: Never Smoker   . Smokeless tobacco: Not on file  . Alcohol Use: 7.2 oz/week    12 Cans of beer per week     Comment: drinks socially, qweek to [employer], usually drnks >12 beers per social encounter    ROS as above Medications: Current Facility-Administered Medications  Medication Dose Route Frequency Provider Last Rate Last Dose  . albuterol (PROVENTIL HFA;VENTOLIN HFA) 108 (90 BASE) MCG/ACT inhaler 2 puff  2 puff Inhalation Q4H PRN Rodolph BongEvan S Ermagene Saidi, MD   2 puff at 02/08/14 1000   Current Outpatient Prescriptions  Medication Sig Dispense Refill  . azithromycin (ZITHROMAX) 250 MG tablet Take 1 tablet (250 mg total) by mouth daily. Take first 2 tablets together, then 1 every day until finished.  6 tablet  0  . predniSONE (DELTASONE) 10 MG tablet Take 3 tablets (30 mg total) by mouth daily.  15 tablet  0  . traMADol (ULTRAM) 50 MG tablet Take 1 tablet (50 mg total) by mouth at bedtime as needed (cough).  10 tablet  0    Exam:  BP 134/94  Pulse 89  Temp(Src) 98.3 F (36.8 C) (Oral)  Resp 16  SpO2 100%  LMP 02/08/2014 Gen: Well NAD HEENT: EOMI,  MMM clear nasal discharge. Normal posterior pharynx and tympanic membranes. Lungs: Normal work of breathing. Wheezing present with expiratory phase bilaterally Heart: RRR no MRG Abd: NABS, Soft. NT, ND Exts: Brisk capillary refill,  warm and well perfused.   Patient received a DuoNeb nebulizer treatment resulting in subjective improvement and normalization of her pulmonary exam.  No results found for this or any previous visit (from the past 24 hour(s)). No results found.  Assessment and Plan: [age] y.o. female with bronchitis. Likely allergic component. Plan she was prednisone and over-the-counter antihistamines. Additionally tramadol for cough suppression and azithromycin for use if not improving.  Discussed warning signs or symptoms. Please see discharge instructions. Patient expresses understanding.    Rodolph BongEvan S Joaquin Knebel, MD 02/08/14 1006

## 2014-02-26 ENCOUNTER — Encounter (HOSPITAL_COMMUNITY): Payer: Self-pay | Admitting: Emergency Medicine

## 2014-02-26 ENCOUNTER — Emergency Department (INDEPENDENT_AMBULATORY_CARE_PROVIDER_SITE_OTHER): Payer: Self-pay

## 2014-02-26 ENCOUNTER — Emergency Department (INDEPENDENT_AMBULATORY_CARE_PROVIDER_SITE_OTHER)
Admission: EM | Admit: 2014-02-26 | Discharge: 2014-02-26 | Disposition: A | Discharge status: Home / Self Care | Payer: Self-pay | Source: Home / Self Care

## 2014-02-26 DIAGNOSIS — J45901 Unspecified asthma with (acute) exacerbation: Secondary | ICD-10-CM

## 2014-02-26 MED ORDER — METHYLPREDNISOLONE SODIUM SUCC 125 MG IJ SOLR
125.0000 mg | Freq: Once | INTRAMUSCULAR | Status: AC
  Administered 2014-02-26: 125 mg via INTRAMUSCULAR

## 2014-02-26 MED ORDER — IPRATROPIUM BROMIDE 0.02 % IN SOLN
RESPIRATORY_TRACT | Status: AC
  Filled 2014-02-26: qty 2.5

## 2014-02-26 MED ORDER — ALBUTEROL SULFATE (2.5 MG/3ML) 0.083% IN NEBU
5.0000 mg | INHALATION_SOLUTION | Freq: Once | RESPIRATORY_TRACT | Status: AC
  Administered 2014-02-26: 5 mg via RESPIRATORY_TRACT

## 2014-02-26 MED ORDER — ALBUTEROL SULFATE (2.5 MG/3ML) 0.083% IN NEBU
INHALATION_SOLUTION | RESPIRATORY_TRACT | Status: AC
  Filled 2014-02-26: qty 6

## 2014-02-26 MED ORDER — IPRATROPIUM BROMIDE 0.02 % IN SOLN
0.5000 mg | Freq: Once | RESPIRATORY_TRACT | Status: AC
  Administered 2014-02-26: 0.5 mg via RESPIRATORY_TRACT

## 2014-02-26 MED ORDER — ALBUTEROL SULFATE HFA 108 (90 BASE) MCG/ACT IN AERS: 2.0000 | INHALATION_SPRAY | Freq: Four times a day (QID) | RESPIRATORY_TRACT | Status: DC | PRN

## 2014-02-26 MED ORDER — METHYLPREDNISOLONE SODIUM SUCC 125 MG IJ SOLR
INTRAMUSCULAR | Status: AC
  Filled 2014-02-26: qty 2

## 2014-02-26 MED ORDER — METHYLPREDNISOLONE 4 MG PO KIT: PACK | ORAL | Status: DC

## 2014-02-26 MED ORDER — MONTELUKAST SODIUM 10 MG PO TABS: 10.0000 mg | ORAL_TABLET | Freq: Every day | ORAL | Status: DC

## 2014-02-26 NOTE — ED Notes (Signed)
Pt  Has  Symptoms  Of a  persistant  Cough          And  Wheezing         Seen may 1  For  bronchitis             Was  rx  An inhaler          She  Is  sitting  Upright  On  Exam table      Speaking in  Complete  sentances

## 2014-02-26 NOTE — ED Provider Notes (Signed)
CSN: [id number]     Arrival date & time 02/26/14  1051 History   None    Chief Complaint  Patient presents with  . Cough   (Consider location/radiation/quality/duration/timing/severity/associated sxs/prior Treatment) Patient is a 38 y.o. female presenting with cough. The history is provided by the patient.  Cough Cough characteristics:  Non-productive and dry Severity:  Mild Onset quality:  Gradual Duration:  3 weeks Progression:  Unchanged Chronicity:  New (seen 5/1 for same and sx continue. , no fever) Context: exposure to allergens, upper respiratory infection and weather changes   Associated symptoms: shortness of breath and wheezing   Associated symptoms: no chills and no fever     Past Medical History  Diagnosis Date  . Gestational diabetes mellitus     with 2nd child, did not have with children #3 and 4   Past Surgical History  Procedure Laterality Date  . Tubal ligation     Family History  Problem Relation Age of Onset  . Cancer Maternal Grandmother    History  Substance Use Topics  . Smoking status: Never Smoker   . Smokeless tobacco: Not on file  . Alcohol Use: 7.2 oz/week    12 Cans of beer per week     Comment: drinks socially, qweek to [employer], usually drnks >12 beers per social encounter    OB History   Grav Para Term Preterm Abortions TAB SAB Ect Mult Living                 Review of Systems  Constitutional: Negative.  Negative for fever and chills.  HENT: Negative.   Respiratory: Positive for cough, shortness of breath and wheezing.   Cardiovascular: Negative.   Gastrointestinal: Negative.     Allergies  Review of patient's allergies indicates no known allergies.  Home Medications   Prior to Admission medications   Medication Sig Start Date End Date Taking? Authorizing Provider  azithromycin (ZITHROMAX) 250 MG tablet Take 1 tablet (250 mg total) by mouth daily. Take first 2 tablets together, then 1 every day until finished. 02/08/14   Rodolph BongEvan S  Corey, MD  predniSONE (DELTASONE) 10 MG tablet Take 3 tablets (30 mg total) by mouth daily. 02/08/14   Rodolph BongEvan S Corey, MD  traMADol (ULTRAM) 50 MG tablet Take 1 tablet (50 mg total) by mouth at bedtime as needed (cough). 02/08/14   Rodolph BongEvan S Corey, MD   BP 125/88  Pulse 74  Temp(Src) 98.7 F (37.1 C) (Oral)  Resp 18  SpO2 95%  LMP 02/08/2014 Physical Exam  Nursing note and vitals reviewed. Constitutional: She is oriented to person, place, and time. She appears well-developed and well-nourished.  HENT:  Head: Normocephalic.  Mouth/Throat: Oropharynx is clear and moist.  Neck: Normal range of motion. Neck supple.  Pulmonary/Chest: She has wheezes. She has no rales.  Lymphadenopathy:    She has no cervical adenopathy.  Neurological: She is alert and oriented to person, place, and time.  Skin: Skin is warm and dry.    ED Course  Procedures (including critical care time) Labs Review Labs Reviewed - No data to display  Imaging Review Dg Chest 2 View  02/26/2014   CLINICAL DATA:  38 year old female with cough wheezing and bronchitis. Initial encounter.  EXAM: CHEST  2 VIEW  COMPARISON:  None.  FINDINGS: Low normal lung volumes. Visualized tracheal air column is within normal limits. Normal cardiac size and mediastinal contours. No pneumothorax, pulmonary edema, pleural effusion or confluent pulmonary opacity. No acute osseous  abnormality identified.  IMPRESSION: No acute cardiopulmonary abnormality.   Electronically Signed   By: Augusto GambleLee  Hall M.D.   On: 02/26/2014 11:58   X-rays reviewed and report per radiologist.   MDM   1. Asthma exacerbation, mild    Sx much improved after neb .    Linna HoffJames D Trinetta Alemu, MD 02/26/14 [phone]

## 2014-03-18 ENCOUNTER — Ambulatory Visit (INDEPENDENT_AMBULATORY_CARE_PROVIDER_SITE_OTHER): Payer: Self-pay | Admitting: Family Medicine

## 2014-03-18 ENCOUNTER — Encounter: Payer: Self-pay | Admitting: Family Medicine

## 2014-03-18 VITALS — BP 124/83 | HR 87 | Temp 98.1°F | Wt 186.0 lb

## 2014-03-18 DIAGNOSIS — R05 Cough: Secondary | ICD-10-CM

## 2014-03-18 DIAGNOSIS — R059 Cough, unspecified: Secondary | ICD-10-CM

## 2014-03-18 NOTE — Progress Notes (Signed)
   Subjective:    Patient ID: Mary Hebert, female    DOB: [birth year], 38 y.o.   MRN: [id number]  HPI  Presents for evaluation of a cough.  The cough has been going on for 6 weeks. It is productive. It is not worse at night. She does not smoke. She is not around any smokers. She was evaluated for this problem at urgent care in 02/08/14 and diagnosed with bronchitis and treated with prednisone and azithromycin. This did not help so she returned on 02/26/14. An X-ray was performed which was normal. She was prescribed albuterol and singulair. She never filled the albuterol due to cost. She took the Singulair for a few days, but stopped due to diarrhea.   PMH - No history of asthma   Review of Systems     Objective:   Physical Exam BP 124/83  Pulse 87  Temp(Src) 98.1 F (36.7 C) (Oral)  Wt 186 lb (84.369 kg)  LMP 03/08/2014  Gen: obese, Hispanic female, well appearing, pleasant Eyes: no conjunctivitis or allergic shiners Nose: nasal mucosa moist but not boggy OP: clear and moist, no pharyngeal exudates or erythema, no lymphadenopathy Cardiovascular: regular rate and rhythm Lungs: normal work of breathing, clear to auscultation, clear to percussion      Assessment & Plan:

## 2014-03-18 NOTE — Patient Instructions (Signed)
Dear Mrs. Marcus,   Thank you for coming to clinic today. Please read below regarding the issues that we discussed.   1. Cough - This could be due to asthma. Please schedule an appointment with Dr. Raymondo Band to do lung tests. You can schedule this at the front desk.   2. Physical Exam - Please follow up in July for a visit with your new doctor and a physical exam.   Please follow up in clinic in 1 month. Please call earlier if you have any questions or concerns.   Sincerely,   Dr. Clinton Sawyer

## 2014-03-18 NOTE — Assessment & Plan Note (Signed)
A: differential diagnosis includes asthma, allergies, GERD, post viral; no red flags for malignancyP:  - Patient will try over-the-counter omeprazole to see if this helps - Patient return for pulmonary function testing with the pharmacy team and will also record peak flows at home (peak flow obtained at 400 L per minute)

## 2014-03-22 ENCOUNTER — Ambulatory Visit: Payer: Self-pay | Admitting: Pharmacist

## 2014-03-29 ENCOUNTER — Encounter: Payer: Self-pay | Admitting: Pharmacist

## 2014-03-29 ENCOUNTER — Ambulatory Visit (INDEPENDENT_AMBULATORY_CARE_PROVIDER_SITE_OTHER): Payer: Self-pay | Admitting: Pharmacist

## 2014-03-29 VITALS — BP 116/84 | HR 73 | Ht 64.0 in | Wt 186.7 lb

## 2014-03-29 DIAGNOSIS — R059 Cough, unspecified: Secondary | ICD-10-CM

## 2014-03-29 DIAGNOSIS — R05 Cough: Secondary | ICD-10-CM

## 2014-03-29 NOTE — Patient Instructions (Signed)
Continue to use the albuterol as needed and before physical activity.  Follow up with your PCP for you physical later next week.

## 2014-03-29 NOTE — Assessment & Plan Note (Signed)
Persistent Cough with spirometry evaluation reveals normal spirometry.  Patient has been experiencing persistent cough following bronchitis. Patient has completed a course of antibiotics and steroids, and majority of symptoms have since resolved.  Advised patient to use albuterol for physical activity as needed. Reviewed results of pulmonary function tests.  Pt verbalized understanding of results and education.  Written pt instructions provided.  F/U Clinic visit with PCP in a week.   Total time in face to face counseling 15 minutes.  Patient seen with Conception ChancyMegan Shah, PharmD Candidate; Forestine Naadha Patel, PharmD Resident; and Graylon GunningElana Adamo, MD Resident

## 2014-03-29 NOTE — Progress Notes (Signed)
S:    Patient arrives by herself for lung function evaluation.  Patient does not complain of any SOB, but does endorse chronic cough since May. Patient states she does not do much physical activity because she fears getting short of breath. Patient states that she was told she had "temporary asthma" after her recent bronchitis treated with azithromycin and steroids. Patients recorded home peak flow of ~400. Patient endorsed one episode of coughing that resulted in a peak flow of 290.   O: See "scanned report" or Documentation Flowsheet (discrete results - PFTs) for  Spirometry results. Patient provided good effort while attempting spirometry.   A/P: Persistent Cough with spirometry evaluation reveals normal spirometry.  Patient has been experiencing persistent cough following bronchitis. Patient has completed a course of antibiotics and steroids, and majority of symptoms have since resolved.  Advised patient to use albuterol for physical activity as needed. Reviewed results of pulmonary function tests.  Pt verbalized understanding of results and education.  Written pt instructions provided.  F/U Clinic visit with PCP in a week.   Total time in face to face counseling 15 minutes.  Patient seen with Conception ChancyMegan Shah, PharmD Candidate; Forestine Naadha Patel, PharmD Resident; and Graylon GunningElana Adamo, MD Resident

## 2014-04-01 NOTE — Progress Notes (Signed)
Patient ID: Mary Hebert, female   DOB: 01-23-1976, 38 y.o.   MRN: [id number] Reviewed: Agree with Dr. Macky LowerKoval's documentation and management.

## 2014-04-03 ENCOUNTER — Encounter: Payer: Self-pay | Admitting: Family Medicine

## 2014-04-03 ENCOUNTER — Ambulatory Visit (INDEPENDENT_AMBULATORY_CARE_PROVIDER_SITE_OTHER): Payer: Self-pay | Admitting: Family Medicine

## 2014-04-03 VITALS — BP 122/83 | HR 57 | Temp 98.0°F | Ht 64.0 in | Wt 185.3 lb

## 2014-04-03 DIAGNOSIS — E669 Obesity, unspecified: Secondary | ICD-10-CM

## 2014-04-03 LAB — CBC
HEMATOCRIT: 39.2 % (ref 36.0–46.0)
Hemoglobin: 13.3 g/dL (ref 12.0–15.0)
MCH: 32.5 pg (ref 26.0–34.0)
MCHC: 33.9 g/dL (ref 30.0–36.0)
MCV: 95.8 fL (ref 78.0–100.0)
Platelets: 255 10*3/uL (ref 150–400)
RBC: 4.09 MIL/uL (ref 3.87–5.11)
RDW: 13.4 % (ref 11.5–15.5)
WBC: 11 10*3/uL — ABNORMAL HIGH (ref 4.0–10.5)

## 2014-04-03 NOTE — Progress Notes (Signed)
   Subjective:    Patient ID: Mary Hebert, female    DOB: August 12, 1976, 38 y.o.   MRN: [id number]  HPI  38 year old F who presents for annual wellness visit.   Cancer Screening:  A. Cervical - performed 11/12/13, WNL, needs next in 2018 B. Colon - not indicated, no personal or fam hx  C. Breast - not indicated, no personal or family hx  Vaccinations: up to date  Labs:  - needs cholesterol given obesity  Other age-appropriate recommendations: none  Reviewed PMH/PSH/Fam Hx  Social - pt married with 2 teenage children   Review of Systems Positive for female sexual dysfunction and lack of physical exercise Negative for depression, weight gain, chest pain, shortness of breath, abdominal pain, diarrhea, constipation, melena, hematochezia, abnormal vaginal bleeding     Objective:   Physical Exam BP 122/83  Pulse 57  Temp(Src) 98 F (36.7 C) (Oral)  Ht 5\' 4"  (1.626 m)  Wt 185 lb 4.8 oz (84.052 kg)  BMI 31.79 kg/m2  LMP 03/28/2014  Wt Readings from Last 5 Encounters:  04/03/14 185 lb 4.8 oz (84.052 kg)  03/29/14 186 lb 11.2 oz (84.687 kg)  03/18/14 186 lb (84.369 kg)  09/06/11 196 lb (88.905 kg)  09/16/10 188 lb 11.2 oz (85.594 kg)   Gen: middle age hispanic female, well appearing, NAD, pleasant and conversant, obese body habitus  HEENT: NCAT, PERRLA, EOMI, OP clear and moist CV: RRR, no m/r/g, no JVD or carotid bruits Pulm: normal WOB, CTA-B Abd: soft, NDNT, NABS Extremities: no edema or joint tenderness Skin: warm, dry, no rashes Neuro/Psych: A&Ox4, normal affect, speech, and thought content         Assessment & Plan:

## 2014-04-03 NOTE — Patient Instructions (Signed)
[name],   Bonita QuinYou are doing great. I will check your labs and let you know the result on Friday. Please read below about the for books and websites for women with decreased sex drive.   Sincerely,   Dr. Clinton SawyerWilliamson  RESOURCES FOR CLINICIANS AND PATIENTS  ?Books .Becoming Orgasmic: A Sexual and Personal Growth Program for Women, by Yevette EdwardsJulia Heiman & Rexanne ManoJoseph Lopiccolo .For Women Only: A Revolutionary Guide to Overcoming Sexual Dysfunction and Reclaiming Your Sex Life, by Alphonzo GrieveBerman JR, [place], [place]. Marland Kitchen.Getting the Sex You Want: A Woman's Guide to Becoming Proud, Passionate and Pleased in Bed, by Merrill LynchSandra Leiblum & Oneida ArenasJudith Sachs .Illustrated Manual of Sex Therapy, by Murriel HopperHelen Singer Kaplan .Making Love the Way We Used to or Better: Secrets to Satisfying Midlife Sexuality, by Darral DashAlan Altman and Festus HoltsLaurie Asher .My Secret Garden, by Harriett SineNancy Friday .The New Love and Sex After 60, by Webb Silversmithobert Butler & Oswaldo ConroyMyrna Lewis ?Websites .American Association of Sex Educators, Counselors, and Therapists: www.aasect.org .[employer]an College of Obstetricians and Gynecologists: RentRule.com.auwww.acog.org .American Urological Association: http://sanchez-watson.com/www.auanet.org .Kinsey Institute: FindPure.glwww.kinseyinstitute.org .Marguerite OleaNorth American Menopause Society: https://wang.info/www.menopause.org; module on "Sexual Health and Menopause" .Nurture Your Nature: www.nurtureyournature.org .Sexuality Information and Education Council of the [place]: www.siecus.org .Society for Sex Therapy and Research: http://cervantes.com/www.sstarnet.org .Society for the Scientific Study of Sexuality: www.sexscience.org

## 2014-04-04 ENCOUNTER — Encounter: Payer: Self-pay | Admitting: Family Medicine

## 2014-04-04 LAB — BASIC METABOLIC PANEL
BUN: 14 mg/dL (ref 6–23)
CHLORIDE: 102 meq/L (ref 96–112)
CO2: 24 meq/L (ref 19–32)
CREATININE: 0.77 mg/dL (ref 0.50–1.10)
Calcium: 9.3 mg/dL (ref 8.4–10.5)
Glucose, Bld: 83 mg/dL (ref 70–99)
Potassium: 4.2 mEq/L (ref 3.5–5.3)
SODIUM: 137 meq/L (ref 135–145)

## 2014-04-04 LAB — LIPID PANEL
CHOL/HDL RATIO: 4.8 ratio
CHOLESTEROL: 206 mg/dL — AB (ref 0–200)
HDL: 43 mg/dL (ref >39)
LDL CALC: 123 mg/dL — AB (ref 0–99)
TRIGLYCERIDES: 198 mg/dL — AB (ref <150)
VLDL: 40 mg/dL (ref 0–40)

## 2014-07-12 ENCOUNTER — Ambulatory Visit: Payer: Self-pay

## 2015-02-01 ENCOUNTER — Emergency Department (INDEPENDENT_AMBULATORY_CARE_PROVIDER_SITE_OTHER)
Admission: EM | Admit: 2015-02-01 | Discharge: 2015-02-01 | Disposition: A | Discharge status: Home / Self Care | Payer: Self-pay | Source: Home / Self Care | Attending: Family Medicine | Admitting: Family Medicine

## 2015-02-01 ENCOUNTER — Emergency Department (INDEPENDENT_AMBULATORY_CARE_PROVIDER_SITE_OTHER): Payer: Self-pay

## 2015-02-01 ENCOUNTER — Encounter (HOSPITAL_COMMUNITY): Payer: Self-pay | Admitting: Emergency Medicine

## 2015-02-01 DIAGNOSIS — J40 Bronchitis, not specified as acute or chronic: Secondary | ICD-10-CM

## 2015-02-01 MED ORDER — IPRATROPIUM-ALBUTEROL 0.5-2.5 (3) MG/3ML IN SOLN
RESPIRATORY_TRACT | Status: AC
  Filled 2015-02-01: qty 3

## 2015-02-01 MED ORDER — TRAMADOL HCL 50 MG PO TABS: 50.0000 mg | ORAL_TABLET | Freq: Every evening | ORAL | Status: DC | PRN

## 2015-02-01 MED ORDER — IPRATROPIUM-ALBUTEROL 0.5-2.5 (3) MG/3ML IN SOLN
3.0000 mL | Freq: Once | RESPIRATORY_TRACT | Status: AC
  Administered 2015-02-01: 3 mL via RESPIRATORY_TRACT

## 2015-02-01 MED ORDER — PREDNISONE 5 MG PO TABS: 30.0000 mg | ORAL_TABLET | Freq: Every day | ORAL | Status: DC

## 2015-02-01 NOTE — ED Provider Notes (Signed)
Mary Hebert is a 39 y.o. female who presents to Urgent Care today for cough wheezing back pain shortness of breath starting about a week ago. No leg swelling fevers chills vomiting or diarrhea. Patient has used some over-the-counter allergy medicines which helped. She has a history of bronchitis last year. Her current symptoms are similar to bronchitis.   Past Medical History  Diagnosis Date  . Gestational diabetes mellitus     with 2nd child, did not have with children #3 and 4   Past Surgical History  Procedure Laterality Date  . Tubal ligation     History  Substance Use Topics  . Smoking status: Never Smoker   . Smokeless tobacco: Not on file  . Alcohol Use: 7.2 oz/week    12 Cans of beer per week     Comment: drinks socially, qweek to [employer], usually drnks >12 beers per social encounter    ROS as above Medications: No current facility-administered medications for this encounter.   Current Outpatient Prescriptions  Medication Sig Dispense Refill  . albuterol (PROVENTIL HFA;VENTOLIN HFA) 108 (90 BASE) MCG/ACT inhaler Inhale 2 puffs into the lungs every 6 (six) hours as needed for wheezing or shortness of breath. 1 Inhaler 0  . predniSONE (DELTASONE) 5 MG tablet Take 6 tablets (30 mg total) by mouth daily. 30 tablet 0  . traMADol (ULTRAM) 50 MG tablet Take 1 tablet (50 mg total) by mouth at bedtime as needed (cough). 10 tablet 0   No Known Allergies   Exam:  BP 128/89 mmHg  Pulse 77  Temp(Src) 98.8 F (37.1 C) (Oral)  Resp 16  SpO2 97%  LMP 01/28/2015 Gen: Well NAD HEENT: EOMI,  MMM posterior pharynx with mild cobblestoning. Normal tympanic membranes bilaterally Lungs: Normal work of breathing. Coarse breath sounds bilaterally with wheezing on expiratory phase bilaterally Heart: RRR no MRG Abd: NABS, Soft. Nondistended, Nontender Exts: Brisk capillary refill, warm and well perfused.   Patient was given a 2.5/0.5 mg DuoNeb nebulizer treatment, and did not feel much  better  No results found for this or any previous visit (from the past 24 hour(s)). Dg Chest 2 View  02/01/2015   CLINICAL DATA:  cough initial encounter. chest congestion, wheezing and runny nose  EXAM: CHEST  2 VIEW  COMPARISON:  None.  FINDINGS: The heart size and mediastinal contours are within normal limits. Both lungs are clear. The visualized skeletal structures are unremarkable.  IMPRESSION: No active cardiopulmonary disease.   Electronically Signed   By: Andreas NewportGeoffrey  Lamke M.D.   On: 02/01/2015 15:21    Assessment and Plan: 39 y.o. female with bronchitis treat with prednisone and tramadol for cough suppression return as needed.  Discussed warning signs or symptoms. Please see discharge instructions. Patient expresses understanding.     Rodolph BongEvan S Ely Spragg, MD 02/01/15 1556

## 2015-02-01 NOTE — Discharge Instructions (Signed)
Thank you for coming in today. ° °Acute Bronchitis °Bronchitis is inflammation of the airways that extend from the windpipe into the lungs (bronchi). The inflammation often causes mucus to develop. This leads to a cough, which is the most common symptom of bronchitis.  °In acute bronchitis, the condition usually develops suddenly and goes away over time, usually in a couple weeks. Smoking, allergies, and asthma can make bronchitis worse. Repeated episodes of bronchitis may cause further lung problems.  °CAUSES °Acute bronchitis is most often caused by the same virus that causes a cold. The virus can spread from person to person (contagious) through coughing, sneezing, and touching contaminated objects. °SIGNS AND SYMPTOMS  °· Cough.   °· Fever.   °· Coughing up mucus.   °· Body aches.   °· Chest congestion.   °· Chills.   °· Shortness of breath.   °· Sore throat.   °DIAGNOSIS  °Acute bronchitis is usually diagnosed through a physical exam. Your health care provider will also ask you questions about your medical history. Tests, such as chest X-rays, are sometimes done to rule out other conditions.  °TREATMENT  °Acute bronchitis usually goes away in a couple weeks. Oftentimes, no medical treatment is necessary. Medicines are sometimes given for relief of fever or cough. Antibiotic medicines are usually not needed but may be prescribed in certain situations. In some cases, an inhaler may be recommended to help reduce shortness of breath and control the cough. A cool mist vaporizer may also be used to help thin bronchial secretions and make it easier to clear the chest.  °HOME CARE INSTRUCTIONS °· Get plenty of rest.   °· Drink enough fluids to keep your urine clear or pale yellow (unless you have a medical condition that requires fluid restriction). Increasing fluids may help thin your respiratory secretions (sputum) and reduce chest congestion, and it will prevent dehydration.   °· Take medicines only as directed by  your health care provider. °· If you were prescribed an antibiotic medicine, finish it all even if you start to feel better. °· Avoid smoking and secondhand smoke. Exposure to cigarette smoke or irritating chemicals will make bronchitis worse. If you are a smoker, consider using nicotine gum or skin patches to help control withdrawal symptoms. Quitting smoking will help your lungs heal faster.   °· Reduce the chances of another bout of acute bronchitis by washing your hands frequently, avoiding people with cold symptoms, and trying not to touch your hands to your mouth, nose, or eyes.   °· Keep all follow-up visits as directed by your health care provider.   °SEEK MEDICAL CARE IF: °Your symptoms do not improve after 1 week of treatment.  °SEEK IMMEDIATE MEDICAL CARE IF: °· You develop an increased fever or chills.   °· You have chest pain.   °· You have severe shortness of breath. °· You have bloody sputum.   °· You develop dehydration. °· You faint or repeatedly feel like you are going to pass out. °· You develop repeated vomiting. °· You develop a severe headache. °MAKE SURE YOU:  °· Understand these instructions. °· Will watch your condition. °· Will get help right away if you are not doing well or get worse. °Document Released: 11/04/2004 Document Revised: 02/11/2014 Document Reviewed: 03/20/2013 °ExitCare® Patient Information ©2015 [employer]. This information is not intended to replace advice given to you by your health care provider. Make sure you discuss any questions you have with your health care provider. ° °

## 2015-02-01 NOTE — ED Notes (Signed)
C/o cough and asthma States she does have chest congestion, wheezing and runny nose States cough is productive with clear mucous

## 2015-02-14 ENCOUNTER — Encounter: Payer: Self-pay | Admitting: Family Medicine

## 2015-02-14 ENCOUNTER — Ambulatory Visit (INDEPENDENT_AMBULATORY_CARE_PROVIDER_SITE_OTHER): Payer: Self-pay | Admitting: Family Medicine

## 2015-02-14 VITALS — BP 136/84 | HR 89 | Temp 98.7°F | Ht 64.0 in | Wt 179.9 lb

## 2015-02-14 DIAGNOSIS — R062 Wheezing: Secondary | ICD-10-CM

## 2015-02-14 MED ORDER — CETIRIZINE HCL 10 MG PO TABS: 10.0000 mg | ORAL_TABLET | Freq: Every day | ORAL | Status: DC

## 2015-02-14 MED ORDER — ALBUTEROL SULFATE HFA 108 (90 BASE) MCG/ACT IN AERS
2.0000 | INHALATION_SPRAY | Freq: Four times a day (QID) | RESPIRATORY_TRACT | Status: DC | PRN
Start: 2015-02-14 — End: 2018-02-07

## 2015-02-14 MED ORDER — ALBUTEROL SULFATE HFA 108 (90 BASE) MCG/ACT IN AERS: 2.0000 | INHALATION_SPRAY | Freq: Four times a day (QID) | RESPIRATORY_TRACT | Status: DC | PRN

## 2015-02-14 MED ORDER — PREDNISONE 50 MG PO TABS: 50.0000 mg | ORAL_TABLET | Freq: Every day | ORAL | Status: DC

## 2015-02-14 MED ORDER — ALBUTEROL SULFATE (2.5 MG/3ML) 0.083% IN NEBU
2.5000 mg | INHALATION_SOLUTION | Freq: Once | RESPIRATORY_TRACT | Status: AC
  Administered 2015-02-14: 2.5 mg via RESPIRATORY_TRACT

## 2015-02-14 NOTE — Patient Instructions (Signed)
Take prednisone 50mg  daily for 5 days Use albuterol inhaler 2 puffs every 4-6 hours as needed Start zyrtec 10mg  daily Follow up with Dr. Richarda BladeAdamo in 3 weeks.  Be well, Dr. Pollie MeyerMcIntyre

## 2015-02-18 NOTE — Progress Notes (Signed)
Patient ID: Candise CheGema R Slider, female   DOB: June 24, 1976, 39 y.o.   MRN: [id number] Date of Visit: 02/14/2015   HPI:  Pt presents for a same day appointment to discuss wheezing.  Pt reports she was treated for bronchitis on April 23. Given a neb. Wednesday began sneezing. Last night started coughing and wheezing. No sick contacts. No fevers. Normal PO intake. Does not have albuterol inhaler.  ROS: See HPI  PMFSH: previously healthy  PHYSICAL EXAM: BP 136/84 mmHg  Pulse 89  Temp(Src) 98.7 F (37.1 C) (Oral)  Ht 5\' 4"  (1.626 m)  Wt 179 lb 14.4 oz (81.602 kg)  BMI 30.86 kg/m2  LMP 01/28/2015 Gen: NAD HEENT: NCAT, oropharynx clear and moist, MMM, TM's clear bilat Heart: RRR no murmur Lungs: NWOB. Speaks in full sentences without distress. End expiratory wheezes throughout with good air movement Neuro: grossly nonfocal speech normal  ASSESSMENT/PLAN:  1. Wheezing - appears to have allergic trigger possibly. Albuterol neb given today in clinic. Would benefit from repeat steroid course. Will rx prednisone 50mg  daily x 5 days. Given rx for albuterol inhaler as well. Will start empiric tx with zyrtec 10mg  daily. F/u with PCP in 3 weeks to eval for improvement, sooner if not improving.  FOLLOW UP: F/u in 3 weeks with PCP for allergies/wheezing  GrenadaBrittany J. Pollie MeyerMcIntyre, MD [facility]

## 2015-03-07 ENCOUNTER — Ambulatory Visit: Payer: Self-pay | Admitting: Family Medicine

## 2016-02-15 ENCOUNTER — Other Ambulatory Visit: Payer: Self-pay | Admitting: Family Medicine

## 2016-02-15 DIAGNOSIS — J309 Allergic rhinitis, unspecified: Secondary | ICD-10-CM

## 2017-02-24 ENCOUNTER — Other Ambulatory Visit: Payer: Self-pay | Admitting: *Deleted

## 2017-02-25 MED ORDER — CETIRIZINE HCL 10 MG PO TABS: 10.0000 mg | ORAL_TABLET | Freq: Every day | ORAL | 11 refills | Status: DC

## 2018-01-31 ENCOUNTER — Encounter: Payer: Self-pay | Admitting: Internal Medicine

## 2018-02-07 ENCOUNTER — Ambulatory Visit: Payer: Self-pay | Admitting: Internal Medicine

## 2018-02-07 ENCOUNTER — Other Ambulatory Visit (HOSPITAL_COMMUNITY)
Admission: RE | Admit: 2018-02-07 | Discharge: 2018-02-07 | Disposition: A | Discharge status: Home / Self Care | Payer: Self-pay | Source: Ambulatory Visit | Attending: Family Medicine | Admitting: Family Medicine

## 2018-02-07 ENCOUNTER — Encounter: Payer: Self-pay | Admitting: Internal Medicine

## 2018-02-07 VITALS — BP 118/80 | HR 97 | Temp 98.7°F | Wt 196.0 lb

## 2018-02-07 DIAGNOSIS — Z6833 Body mass index (BMI) 33.0-33.9, adult: Secondary | ICD-10-CM

## 2018-02-07 DIAGNOSIS — J302 Other seasonal allergic rhinitis: Secondary | ICD-10-CM

## 2018-02-07 DIAGNOSIS — Z0000 Encounter for general adult medical examination without abnormal findings: Secondary | ICD-10-CM | POA: Insufficient documentation

## 2018-02-07 DIAGNOSIS — E6609 Other obesity due to excess calories: Secondary | ICD-10-CM

## 2018-02-07 DIAGNOSIS — Z01419 Encounter for gynecological examination (general) (routine) without abnormal findings: Secondary | ICD-10-CM

## 2018-02-07 DIAGNOSIS — Z113 Encounter for screening for infections with a predominantly sexual mode of transmission: Secondary | ICD-10-CM

## 2018-02-07 LAB — POCT WET PREP (WET MOUNT)
CLUE CELLS WET PREP WHIFF POC: POSITIVE
TRICHOMONAS WET PREP HPF POC: ABSENT

## 2018-02-07 MED ORDER — METRONIDAZOLE 500 MG PO TABS: 500.0000 mg | ORAL_TABLET | Freq: Two times a day (BID) | ORAL | 0 refills | Status: DC

## 2018-02-07 MED ORDER — FLUCONAZOLE 150 MG PO TABS: ORAL_TABLET | ORAL | 0 refills | Status: DC

## 2018-02-07 MED ORDER — FLUTICASONE PROPIONATE 50 MCG/ACT NA SUSP: 2.0000 | Freq: Every day | NASAL | 6 refills | Status: DC

## 2018-02-07 MED ORDER — CETIRIZINE HCL 10 MG PO TABS: 10.0000 mg | ORAL_TABLET | Freq: Every day | ORAL | 11 refills | Status: DC

## 2018-02-07 NOTE — Progress Notes (Signed)
[facility] Progress Note  Subjective:  Mary Hebert is a 42 y.o. female with history of obesity who presents for general check-up.  Concerns include:  -Weight. Patient says she just recently cut out soda from her diet and has replaced it with water. She does not enjoy going to the gym but has enjoyed doing zumba in the past. She would like to commit to going to zumba 5 times a week. She has a friend who might join her. -Urine looks very yellow. Has not been drinking much water.  -Would like STDs checked. She had been with a long-term female partner but recently broke things off and has been sexually active with 1 new female partner. Does not use condoms. Had a tubal ligation. About 2 weeks ago had some increased vaginal discharge.  HM: [facility] in [facility] Patient says results were normal but unclear if HPV testing had been performed. No family history of cancers to recommend early mammogram or colonoscopy. Has not had a visit or labwork in almost 3 years.  Social: - Never smoker. - Occasionally (once a month or less) drinks EtOH usually 1-2 drinks on occasion. - Does not use illegal drugs.  - Working single mom.  No Known Allergies  Social History   Tobacco Use  . Smoking status: Never Smoker  . Smokeless tobacco: Never Used  Substance Use Topics  . Alcohol use: Yes    Alcohol/week: 7.2 oz    Types: 12 Cans of beer per week    Comment: drinks socially, qweek to [employer], usually drnks >12 beers per social encounter     Objective: Blood pressure 118/80, pulse 97, temperature 98.7 F (37.1 C), temperature source Oral, weight 196 lb (88.9 kg), SpO2 98 %. Body mass index is 33.64 kg/m. Constitutional: Pleasant, well-appearing female in NAD HENT: Swollen and erythematous nasal turbinates. TMs normal.  Cardiovascular: RRR, S1, S2, no m/r/g.  Pulmonary/Chest: Effort normal and breath sounds normal.  Abdominal: Soft. +BS,  NT Musculoskeletal: No CVA tenderness. No LE edema.  GU: Chaperone present. Minimal white vaginal discharge present on speculum exam. No cervical motion tenderness on bimanual exam.  Neurological: AOx3, no focal deficits. Skin: Skin is warm and dry. No rash noted.  Psychiatric: Normal mood and affect.  Vitals reviewed  Assessment/Plan: Well woman exam - Obtained pap smear with reflex HPV testing.  Screen for STD (sexually transmitted disease) - Obtained wet prep given complaint of increased vaginal discharge. This was positive for BV. Discussed with patient who prefers to have this treated. Ordered metronidazole and diflucan in case yeast infections results. - Ordered gc/chlamydia testing, HIV/RPR blood testing.  - Recommended using condoms.  Seasonal allergies - Recommended trying flonase and zyrtec.   OBESITY, NOS - Discussed health goals with patient. She has cut out soda and plans to attend zumba classes multiple times a week. - Counseled patient that 150 minutes of moderate exercise recommended weekly.  - Will obtain lipid panel and CMP.  Follow-up prn.  Dani Gobble, MD [facility], PGY-3

## 2018-02-07 NOTE — Patient Instructions (Addendum)
Ms. Leifheit,  Thank you for coming in today.  I will call with pap smear and other lab results.  Your goals of increasing physical activity and cutting out soda are great.  Try flonase nasal spray (2 sprays each nostril daily) and zyrtec 10 mg nightly for allergies. Zyrtec works similarly to benadryl, so do not use these at the same time.  Best, Dr. Sampson Goon

## 2018-02-08 ENCOUNTER — Telehealth: Payer: Self-pay

## 2018-02-08 ENCOUNTER — Encounter: Payer: Self-pay | Admitting: Internal Medicine

## 2018-02-08 DIAGNOSIS — J302 Other seasonal allergic rhinitis: Secondary | ICD-10-CM | POA: Insufficient documentation

## 2018-02-08 DIAGNOSIS — Z113 Encounter for screening for infections with a predominantly sexual mode of transmission: Secondary | ICD-10-CM | POA: Insufficient documentation

## 2018-02-08 LAB — HIV ANTIBODY (ROUTINE TESTING W REFLEX): HIV Screen 4th Generation wRfx: NONREACTIVE

## 2018-02-08 LAB — COMPREHENSIVE METABOLIC PANEL
ALBUMIN: 4.2 g/dL (ref 3.5–5.5)
ALT: 15 IU/L (ref 0–32)
AST: 14 IU/L (ref 0–40)
Albumin/Globulin Ratio: 1.4 (ref 1.2–2.2)
Alkaline Phosphatase: 102 IU/L (ref 39–117)
BUN / CREAT RATIO: 16 (ref 9–23)
BUN: 13 mg/dL (ref 6–24)
Bilirubin Total: 0.3 mg/dL (ref 0.0–1.2)
CHLORIDE: 103 mmol/L (ref 96–106)
CO2: 23 mmol/L (ref 20–29)
Calcium: 9.7 mg/dL (ref 8.7–10.2)
Creatinine, Ser: 0.8 mg/dL (ref 0.57–1.00)
GFR calc Af Amer: 104 mL/min/{1.73_m2} (ref >59)
GFR calc non Af Amer: 91 mL/min/{1.73_m2} (ref >59)
Globulin, Total: 3 g/dL (ref 1.5–4.5)
Glucose: 94 mg/dL (ref 65–99)
Potassium: 4.3 mmol/L (ref 3.5–5.2)
Sodium: 138 mmol/L (ref 134–144)
Total Protein: 7.2 g/dL (ref 6.0–8.5)

## 2018-02-08 LAB — LIPID PANEL
Chol/HDL Ratio: 3.8 ratio (ref 0.0–4.4)
Cholesterol, Total: 182 mg/dL (ref 100–199)
HDL: 48 mg/dL (ref >39)
LDL CALC: 112 mg/dL — AB (ref 0–99)
TRIGLYCERIDES: 108 mg/dL (ref 0–149)
VLDL CHOLESTEROL CAL: 22 mg/dL (ref 5–40)

## 2018-02-08 LAB — CERVICOVAGINAL ANCILLARY ONLY
Chlamydia: NEGATIVE
NEISSERIA GONORRHEA: NEGATIVE

## 2018-02-08 LAB — RPR: RPR Ser Ql: NONREACTIVE

## 2018-02-08 NOTE — Assessment & Plan Note (Signed)
-   Obtained wet prep given complaint of increased vaginal discharge. This was positive for BV. Discussed with patient who prefers to have this treated. Ordered metronidazole and diflucan in case yeast infections results. - Ordered gc/chlamydia testing, HIV/RPR blood testing.  - Recommended using condoms.

## 2018-02-08 NOTE — Assessment & Plan Note (Signed)
-   Obtained pap smear with reflex HPV testing.

## 2018-02-08 NOTE — Assessment & Plan Note (Addendum)
-   Discussed health goals with patient. She has cut out soda and plans to attend zumba classes multiple times a week. - Counseled patient that 150 minutes of moderate exercise recommended weekly.  - Will obtain lipid panel and CMP.

## 2018-02-08 NOTE — Telephone Encounter (Signed)
Reached patient and let her know she has BV and that I ordered metronidazole for her. She expressed understanding.  Dani Gobble, MD [facility], PGY-3

## 2018-02-08 NOTE — Assessment & Plan Note (Signed)
-   Recommended trying flonase and zyrtec.

## 2018-02-08 NOTE — Telephone Encounter (Signed)
Pt returning call regarding lab work Call back [phone] Shawna Orleans, RN

## 2018-02-09 LAB — CYTOLOGY - PAP
Diagnosis: NEGATIVE
HPV: NOT DETECTED

## 2018-02-10 ENCOUNTER — Encounter: Payer: Self-pay | Admitting: Internal Medicine

## 2018-02-24 ENCOUNTER — Ambulatory Visit (HOSPITAL_COMMUNITY)
Admission: EM | Admit: 2018-02-24 | Discharge: 2018-02-24 | Disposition: A | Discharge status: Home / Self Care | Payer: Self-pay | Attending: Family Medicine | Admitting: Family Medicine

## 2018-02-24 ENCOUNTER — Encounter (HOSPITAL_COMMUNITY): Payer: Self-pay

## 2018-02-24 DIAGNOSIS — J0100 Acute maxillary sinusitis, unspecified: Secondary | ICD-10-CM

## 2018-02-24 DIAGNOSIS — R059 Cough, unspecified: Secondary | ICD-10-CM

## 2018-02-24 DIAGNOSIS — R05 Cough: Secondary | ICD-10-CM

## 2018-02-24 MED ORDER — AMOXICILLIN-POT CLAVULANATE 875-125 MG PO TABS: 1.0000 | ORAL_TABLET | Freq: Two times a day (BID) | ORAL | 0 refills | Status: AC

## 2018-02-24 MED ORDER — CETIRIZINE-PSEUDOEPHEDRINE ER 5-120 MG PO TB12: 1.0000 | ORAL_TABLET | Freq: Every day | ORAL | 0 refills | Status: DC

## 2018-02-24 NOTE — ED Triage Notes (Signed)
Pt presents with complaints of sinus pressure for over a week.

## 2018-02-24 NOTE — ED Provider Notes (Signed)
[facility]   [id number] 02/24/18 Arrival Time: 1733  SUBJECTIVE: History from: patient.  Mary Hebert is a 42 y.o. female who presents with abrupt onset of sinus pressure and mild dry cough for 1 week .  Denies positive sick exposure or precipitating event.  Has tried sudafed and ibuprofen without relief.  Symptoms are made worse with leaning forward.  Denies similar symptoms in the past. Complains rhinorrhea and occasional tooth pain.  Denies fever, chills, fatigue, rhinorrhea, sore throat, SOB, wheezing, chest pain, nausea, changes in bowel or bladder habits.     ROS: As per HPI.  Past Medical History:  Diagnosis Date  . Gestational diabetes mellitus    with 2nd child, did not have with children #3 and 4   Past Surgical History:  Procedure Laterality Date  . TUBAL LIGATION     No Known Allergies No current facility-administered medications on file prior to encounter.    Current Outpatient Medications on File Prior to Encounter  Medication Sig Dispense Refill  . cetirizine (ZYRTEC) 10 MG tablet Take 1 tablet (10 mg total) by mouth daily. 30 tablet 11  . fluconazole (DIFLUCAN) 150 MG tablet Take 1 tablet if develop thick white discharge or itching. 1 tablet 0  . fluticasone (FLONASE) 50 MCG/ACT nasal spray Place 2 sprays into both nostrils daily. 16 g 6  . metroNIDAZOLE (FLAGYL) 500 MG tablet Take 1 tablet (500 mg total) by mouth 2 (two) times daily. 14 tablet 0   Social History   Socioeconomic History  . Marital status: Single    Spouse name: Not on file  . Number of children: Not on file  . Years of education: Not on file  . Highest education level: Not on file  Occupational History  . Not on file  Social Needs  . Financial resource strain: Not on file  . Food insecurity:    Worry: Not on file    Inability: Not on file  . Transportation needs:    Medical: Not on file    Non-medical: Not on file  Tobacco Use  . Smoking status: Never Smoker  .  Smokeless tobacco: Never Used  Substance and Sexual Activity  . Alcohol use: Yes    Alcohol/week: 7.2 oz    Types: 12 Cans of beer per week    Comment: drinks socially, qweek to [employer], usually drnks >12 beers per social encounter   . Drug use: No  . Sexual activity: Yes    Birth control/protection: Surgical  Lifestyle  . Physical activity:    Days per week: Not on file    Minutes per session: Not on file  . Stress: Not on file  Relationships  . Social connections:    Talks on phone: Not on file    Gets together: Not on file    Attends religious service: Not on file    Active member of club or organization: Not on file    Attends meetings of clubs or organizations: Not on file    Relationship status: Not on file  . Intimate partner violence:    Fear of current or ex partner: Not on file    Emotionally abused: Not on file    Physically abused: Not on file    Forced sexual activity: Not on file  Other Topics Concern  . Not on file  Social History Narrative  . Not on file   Family History  Problem Relation Age of Onset  . Cancer Maternal Grandmother  OBJECTIVE:  Vitals:   02/24/18 1756  BP: 134/84  Pulse: 88  Resp: 16  Temp: 98.8 F (37.1 C)  TempSrc: Oral  SpO2: 98%     General appearance: alert; appears fatigued HEENT: Ears: EACs clear, TMs pearly gray with visible cone of light, without erythema; Eyes: PERRL, EOMI grossly; Frontal and maxillary sinuses tender to palpation; Nose: clear rhinorrhea; Throat: oropharynx mildly erythematous, tonsils 1+ without white tonsillar exudates, uvula midline Neck: supple without LAD Lungs: CTA without adventitious breath sounds Heart: regular rate and rhythm.  Radial pulses 2+ symmetrical bilaterally Skin: warm and dry Psychological: alert and cooperative; normal mood and affect  Imaging: No results found.   ASSESSMENT & PLAN:  1. Acute non-recurrent maxillary sinusitis   2. Cough     Meds ordered this  encounter  Medications  . amoxicillin-clavulanate (AUGMENTIN) 875-125 MG tablet    Sig: Take 1 tablet by mouth every 12 (twelve) hours for 10 days.    Dispense:  20 tablet    Refill:  0    Order Specific Question:   Supervising Provider    Answer:   Isa Rankin [phone]  . cetirizine-pseudoephedrine (ZYRTEC-D) 5-120 MG tablet    Sig: Take 1 tablet by mouth daily.    Dispense:  30 tablet    Refill:  0    Order Specific Question:   Supervising Provider    Answer:   Isa Rankin [[id number]]    Prescribed augmentin Prescribed zyrtec D Get plenty of rest, drink lots of fluids Use OTC ibuprofen or tylenol as needed for symptomatic relief Follow up with PCP if symptoms persists Return or go to ER if you have any new or worsening symptoms Reviewed expectations re: course of current medical issues. Questions answered. Outlined signs and symptoms indicating need for more acute intervention. Patient verbalized understanding. After Visit Summary given.         Rennis Harding, PA-C 02/24/18 1909

## 2018-02-24 NOTE — Discharge Instructions (Addendum)
Prescribed augmentin Prescribed zyrtec D Get plenty of rest, drink lots of fluids Use OTC ibuprofen or tylenol as needed for symptomatic relief Follow up with PCP if symptoms persists Return or go to ER if you have any new or worsening symptoms

## 2018-05-20 ENCOUNTER — Emergency Department (HOSPITAL_COMMUNITY): Payer: Self-pay

## 2018-05-20 ENCOUNTER — Other Ambulatory Visit: Payer: Self-pay

## 2018-05-20 ENCOUNTER — Encounter (HOSPITAL_COMMUNITY): Payer: Self-pay | Admitting: Emergency Medicine

## 2018-05-20 ENCOUNTER — Emergency Department (HOSPITAL_COMMUNITY)
Admission: EM | Admit: 2018-05-20 | Discharge: 2018-05-20 | Disposition: A | Discharge status: Home / Self Care | Payer: Self-pay | Attending: Emergency Medicine | Admitting: Emergency Medicine

## 2018-05-20 DIAGNOSIS — W2203XA Walked into furniture, initial encounter: Secondary | ICD-10-CM | POA: Insufficient documentation

## 2018-05-20 DIAGNOSIS — Y9389 Activity, other specified: Secondary | ICD-10-CM | POA: Insufficient documentation

## 2018-05-20 DIAGNOSIS — S91209A Unspecified open wound of unspecified toe(s) with damage to nail, initial encounter: Secondary | ICD-10-CM

## 2018-05-20 DIAGNOSIS — S99922A Unspecified injury of left foot, initial encounter: Secondary | ICD-10-CM | POA: Insufficient documentation

## 2018-05-20 DIAGNOSIS — Z23 Encounter for immunization: Secondary | ICD-10-CM | POA: Insufficient documentation

## 2018-05-20 DIAGNOSIS — Y929 Unspecified place or not applicable: Secondary | ICD-10-CM | POA: Insufficient documentation

## 2018-05-20 DIAGNOSIS — Y999 Unspecified external cause status: Secondary | ICD-10-CM | POA: Insufficient documentation

## 2018-05-20 MED ORDER — CEPHALEXIN 250 MG PO CAPS
500.0000 mg | ORAL_CAPSULE | Freq: Once | ORAL | Status: AC
  Administered 2018-05-20: 500 mg via ORAL
  Filled 2018-05-20: qty 2

## 2018-05-20 MED ORDER — ACETAMINOPHEN 500 MG PO TABS
1000.0000 mg | ORAL_TABLET | Freq: Once | ORAL | Status: AC
  Administered 2018-05-20: 1000 mg via ORAL
  Filled 2018-05-20: qty 2

## 2018-05-20 MED ORDER — CEPHALEXIN 500 MG PO CAPS: 500.0000 mg | ORAL_CAPSULE | Freq: Four times a day (QID) | ORAL | 0 refills | Status: AC

## 2018-05-20 MED ORDER — TETANUS-DIPHTH-ACELL PERTUSSIS 5-2.5-18.5 LF-MCG/0.5 IM SUSP
0.5000 mL | Freq: Once | INTRAMUSCULAR | Status: AC
  Administered 2018-05-20: 0.5 mL via INTRAMUSCULAR
  Filled 2018-05-20: qty 0.5

## 2018-05-20 MED ORDER — LIDOCAINE HCL (PF) 1 % IJ SOLN
10.0000 mL | Freq: Once | INTRAMUSCULAR | Status: AC
  Administered 2018-05-20: 10 mL
  Filled 2018-05-20: qty 10

## 2018-05-20 MED ORDER — IBUPROFEN 400 MG PO TABS
600.0000 mg | ORAL_TABLET | Freq: Once | ORAL | Status: AC
  Administered 2018-05-20: 600 mg via ORAL
  Filled 2018-05-20: qty 1

## 2018-05-20 NOTE — ED Triage Notes (Signed)
Pt stubbed her big left toe on a table.  Was bleeding from under toenail but controlled at this time.  Discoloration up toe.

## 2018-05-20 NOTE — Discharge Instructions (Addendum)
You need your stitch removed in 10-14 days.    Please take Ibuprofen (Advil, motrin) and Tylenol (acetaminophen) to relieve your pain.  You may take up to 600 MG (3 pills) of normal strength ibuprofen every 8 hours as needed.  In between doses of ibuprofen you make take tylenol, up to 1,000 mg (two extra strength pills).  Do not take more than 3,000 mg tylenol in a 24 hour period.  Please check all medication labels as many medications such as pain and cold medications may contain tylenol.  Do not drink alcohol while taking these medications.  Do not take other NSAID'S while taking ibuprofen (such as aleve or naproxen).  Please take ibuprofen with food to decrease stomach upset.  You may have diarrhea from the antibiotics.  It is very important that you continue to take the antibiotics even if you get diarrhea unless a medical professional tells you that you may stop taking them.  If you stop too early the bacteria you are being treated for will become stronger and you may need different, more powerful antibiotics that have more side effects and worsening diarrhea.  Please stay well hydrated and consider probiotics as they may decrease the severity of your diarrhea.  Please be aware that if you take any hormonal contraception (birth control pills, nexplanon, the ring, etc) that your birth control will not work while you are taking antibiotics and you need to use back up protection as directed on the birth control medication information insert.

## 2018-05-20 NOTE — ED Provider Notes (Signed)
[facility] Provider Note   CSN: [id number] Arrival date & time: 05/20/18  0134     History   Chief Complaint Chief Complaint  Patient presents with  . Toe Injury    HPI Mary Hebert is a 42 y.o. female with no significant past medical history who presents today for evaluation of a left great toe injury.  She reports that she was moving a table when she stubbed her left big toe on the table causing her nail to come out.  She reports pain across the entire toe.  No interventions tried prior to arrival.  She denies pain in her foot or anywhere else other than on her left great toe.  She is unsure when her last tetanus shot was.  HPI  Past Medical History:  Diagnosis Date  . Gestational diabetes mellitus    with 2nd child, did not have with children #3 and 4    Patient Active Problem List   Diagnosis Date Noted  . Screen for STD (sexually transmitted disease) 02/08/2018  . Seasonal allergies 02/08/2018  . Cough 03/18/2014  . Well woman exam 09/06/2011  . OBESITY, NOS 12/08/2006    Past Surgical History:  Procedure Laterality Date  . TUBAL LIGATION       OB History   None      Home Medications    Prior to Admission medications   Medication Sig Start Date End Date Taking? Authorizing Provider  cephALEXin (KEFLEX) 500 MG capsule Take 1 capsule (500 mg total) by mouth 4 (four) times daily for 7 days. 05/20/18 05/27/18  Cristina GongHammond, Skylor Hughson W, PA-C  cetirizine (ZYRTEC) 10 MG tablet Take 1 tablet (10 mg total) by mouth daily. 02/07/18   Casey BurkittFitzgerald, Hillary Moen, MD  cetirizine-pseudoephedrine (ZYRTEC-D) 5-120 MG tablet Take 1 tablet by mouth daily. 02/24/18   Wurst, GrenadaBrittany, PA-C  fluconazole (DIFLUCAN) 150 MG tablet Take 1 tablet if develop thick white discharge or itching. 02/07/18   Casey BurkittFitzgerald, Hillary Moen, MD  fluticasone [facility](FLONASE) 50 MCG/ACT nasal spray Place 2 sprays into both nostrils daily. 02/07/18   Casey BurkittFitzgerald, Hillary Moen,  MD  metroNIDAZOLE (FLAGYL) 500 MG tablet Take 1 tablet (500 mg total) by mouth 2 (two) times daily. 02/07/18   Casey BurkittFitzgerald, Hillary Moen, MD    Family History Family History  Problem Relation Age of Onset  . Cancer Maternal Grandmother     Social History Social History   Tobacco Use  . Smoking status: Never Smoker  . Smokeless tobacco: Never Used  Substance Use Topics  . Alcohol use: Yes    Alcohol/week: 12.0 standard drinks    Types: 12 Cans of beer per week    Comment: drinks socially, qweek to [employer], usually drnks >12 beers per social encounter   . Drug use: No     Allergies   Patient has no known allergies.   Review of Systems Review of Systems  Constitutional: Negative for chills and fever.  Musculoskeletal:       Left great toe pain  Skin: Positive for wound.       Toe nail on left great toe is obviously displaced and up off the nail bed.   Neurological: Negative for weakness and headaches.     Physical Exam Updated Vital Signs BP 129/82 (BP Location: Right Arm)   Pulse 97   Temp 98.7 F (37.1 C) (Oral)   Resp 18   SpO2 99%   Physical Exam  Constitutional: She is oriented to person,  place, and time. She appears well-developed. No distress.  HENT:  Head: Normocephalic and atraumatic.  Cardiovascular: Intact distal pulses.  Left foot with 2+ DP/PT pulses  Musculoskeletal:  Left great toe is diffusely tender to palpation.  Remainder of left foot is nontender without obvious deformities.  Neurological: She is alert and oriented to person, place, and time.  Sensation intact over left foot.  Skin: Skin is warm and dry. She is not diaphoretic.  Left great toenail is obviously avulsed and elevated off the nail bed.  There is dried blood without active bleeding.  There is bruising on the superior aspect of the left great toe.  Nursing note and vitals reviewed.    ED Treatments / Results  Labs (all labs ordered are listed, but only abnormal results are  displayed) Labs Reviewed - No data to display  EKG None  Radiology Dg Foot Complete Left  Result Date: 05/20/2018 CLINICAL DATA:  Stubbed left first toe while moving table, with bleeding at the nail bed. Initial encounter. EXAM: LEFT FOOT - COMPLETE 3+ VIEW COMPARISON:  Left foot radiographs performed 07/28/2004 FINDINGS: There is no evidence of fracture or dislocation. Displacement of the nail of the great toe is noted. No radiopaque foreign bodies are seen. Visualized joint spaces are preserved. The subtalar joint is unremarkable in appearance. Soft tissue swelling is noted about the great toe. IMPRESSION: No evidence of fracture or dislocation. Electronically Signed   By: Roanna Raider M.D.   On: 05/20/2018 02:25    Procedures .Nail Removal Date/Time: 05/20/2018 5:47 AM Performed by: Cristina Gong, PA-C Authorized by: Cristina Gong, PA-C   Consent:    Consent obtained:  Verbal   Consent given by:  Patient   Risks discussed:  Bleeding, incomplete removal, infection, pain and permanent nail deformity   Alternatives discussed:  No treatment, alternative treatment and referral Location:    Foot:  L big toe Pre-procedure details:    Skin preparation:  ChloraPrep   Preparation: Patient was prepped and draped in the usual sterile fashion   Anesthesia (see MAR for exact dosages):    Anesthesia method:  Nerve block   Block anesthetic:  Lidocaine 1% w/o epi   Block technique:  Digit block   Block injection procedure:  Anatomic landmarks identified, anatomic landmarks palpated, introduced needle, negative aspiration for blood and incremental injection   Block outcome:  Anesthesia achieved Nail Removal:    Nail removal amount: Nail was not fully removed, was left intact at the proximal edge with out disruption.    Nail bed repaired: no     Removed nail replaced and anchored: yes (One 3-0 suture placed distally. )   Nails trimmed:    Number of nails trimmed:   1 Post-procedure details:    Dressing:  Gauze roll and Xeroform gauze   Patient tolerance of procedure:  Tolerated well, no immediate complications Comments:     Nailbed irrigated.  There were no lacerations on the nail bed.  Nail was replaced.    (including critical care time)  Medications Ordered in ED Medications  lidocaine (PF) (XYLOCAINE) 1 % injection 10 mL (10 mLs Infiltration Given by Other 05/20/18 0432)  Tdap (BOOSTRIX) injection 0.5 mL (0.5 mLs Intramuscular Given 05/20/18 0433)  cephALEXin (KEFLEX) capsule 500 mg (500 mg Oral Given 05/20/18 0504)  ibuprofen (ADVIL,MOTRIN) tablet 600 mg (600 mg Oral Given 05/20/18 0505)  acetaminophen (TYLENOL) tablet 1,000 mg (1,000 mg Oral Given 05/20/18 0505)     Initial Impression /  Assessment and Plan / ED Course  I have reviewed the triage vital signs and the nursing notes.  Pertinent labs & imaging results that were available during my care of the patient were reviewed by me and considered in my medical decision making (see chart for details).    Mary Hebert presents today for evaluation after she stubbed her toe.  X-rays did not show any fractures.  Nail was partially avulsed.  Her nail was elevated and irrigated.  Nail was replaced and sutured into place.  She was given antibiotics.  Ibuprofen tylenol for pain control.  She was offered crutches but declined.  Tdap updated.   Return precautions were discussed with patient who states their understanding.  At the time of discharge patient denied any unaddressed complaints or concerns.  Patient is agreeable for discharge home.   Final Clinical Impressions(s) / ED Diagnoses   Final diagnoses:  Avulsion of toenail, initial encounter    ED Discharge Orders         Ordered    cephALEXin (KEFLEX) 500 MG capsule  4 times daily     05/20/18 0501           Cristina Gong, PA-C 05/20/18 7846    Dione Booze, MD 05/20/18 [phone]

## 2018-05-20 NOTE — ED Notes (Addendum)
Pt discharged from ED; instructions provided; Pt encouraged to return to ED if symptoms worsen and to f/u with PCP; Pt verbalized understanding of all instructions 

## 2018-05-31 ENCOUNTER — Encounter: Payer: Self-pay | Admitting: Podiatry

## 2018-05-31 ENCOUNTER — Ambulatory Visit: Payer: Self-pay | Admitting: Podiatry

## 2018-05-31 DIAGNOSIS — T148XXA Other injury of unspecified body region, initial encounter: Secondary | ICD-10-CM

## 2018-05-31 DIAGNOSIS — W450XXA Nail entering through skin, initial encounter: Secondary | ICD-10-CM

## 2018-05-31 NOTE — Progress Notes (Signed)
This patient  presents to the office for an evaluation of her left big toenail on her left big toe.  . She says she has stubbed her toe and caused the nail on the left big toe to lift.  She was seen in the emergency department where x-rays were taken and the nail site was cleaned and she was prescribed cephalexin.  . No bone break was noted on the x-ray.   She presents the office today for continued evaluation of her left big toenail.  She says she is not experiencing any pain or discomfort.   No drainage is noted at the site of the nail.  The suture that was placed in the toenail was present.  She presents the office today stating that it's much better and she desires the suture to be removed and her toenail to be evaluated.  General Appearance  Alert, conversant and in no acute stress.  Vascular  Dorsalis pedis and posterior tibial  pulses are palpable  bilaterally.  Capillary return is within normal limits  bilaterally. Temperature is within normal limits  bilaterally.  Neurologic  Senn-Weinstein monofilament wire test within normal limits  bilaterally. Muscle power within normal limits bilaterally.  Nails left hallux toenail appears to have attached to the nailbed and there is a suture present at the distal aspect of the nail.  No evidence of redness or swelling or pain noted.  Orthopedic  No limitations of motion of motion feet .  No crepitus or effusions noted.  No bony pathology or digital deformities noted.  Skin  normotropic skin with no porokeratosis noted bilaterally.  No signs of infections or ulcers noted.    Hematoma left hallux  Nail injury left hallux.  IE.  Marland Kitchen. X-rays were reviewed and reveal a white discoloration medial to the distal phalanx.  No evidence of a bony fracture noted.   The nail was examined and the nail appears to have reattached to the nail bed.  No evidence of any pain or infection noted .   I discussed this condition with this patient.  I recommended we let this nail  declare itself and if it becomes a problem, we will then consider nail  Removal.  . Patient was told to keep peroxide the left hallux nail. Daily  . Return to the clinic when necessary   Helane GuntherGregory Simisola Sandles DPM

## 2019-01-31 ENCOUNTER — Other Ambulatory Visit: Payer: Self-pay

## 2019-01-31 ENCOUNTER — Telehealth (INDEPENDENT_AMBULATORY_CARE_PROVIDER_SITE_OTHER): Payer: HRSA Program | Admitting: Family Medicine

## 2019-01-31 ENCOUNTER — Encounter: Payer: Self-pay | Admitting: Family Medicine

## 2019-01-31 DIAGNOSIS — Z20828 Contact with and (suspected) exposure to other viral communicable diseases: Secondary | ICD-10-CM

## 2019-01-31 DIAGNOSIS — J069 Acute upper respiratory infection, unspecified: Secondary | ICD-10-CM

## 2019-01-31 DIAGNOSIS — Z20822 Contact with and (suspected) exposure to covid-19: Secondary | ICD-10-CM

## 2019-01-31 NOTE — Progress Notes (Signed)
[facility] Telemedicine Visit  Patient consented to have virtual visit. Method of visit: Video was attempted, but technology challenges prevented patient from using video, so visit was conducted via telephone.  Encounter participants: Patient: Mary Hebert - located at home Provider: Westley Chandler - located at [place] [facility] Others (if applicable): husband, in separate room  Chief Complaint: body pain, congestion   HPI: Mary Hebert is a pleasant 43 y.o. with history of obesity and seasonal allergies presenting with body aches and congestion. She lasted work on 01/24/2019. She began to have symptoms on Thursday with body aches. She has congestion, productive cough, and some fatigue. No dyspnea or shortness of breath. No nausea or vomiting. Has a history of a tubal ligation. She has been monitoring her temperature- highest 98.6. 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 She has not worked in 1 week. She thought she had allergies at first but then developed body aches and became worried. She feels much better than she did last week--- she is back to her usual activities within the house.   ROS: per HPI  Pertinent PMHx:  Obesity Tubal ligation - denies symptoms of pregnancy   Exam:  Respiratory: Speaking in full sentences breathing comfortably   Assessment/Plan:  Diagnoses and all orders for this visit:  Close Exposure to Covid-19 Virus, letter will be sent to patient to stay home until symptoms are completely resolved for 3 days. Symptoms have already been present for 7 days. Patient to call back if symptoms worsen. Reviewed strict return precautions. Discussed limitations in testing at this time. Discussed that this may be another URI but with positive exposure at work, COVID19 is likely. Reviewed strict return precautions and reasons to call, patient voiced understanding.   Viral upper respiratory tract  infection Discussed appropriate care for viral illness including humidified air, honey, Tylenol, and Motrin. Handout provided with dosing.    Time spent during visit with patient: 8 minutes

## 2019-02-06 ENCOUNTER — Other Ambulatory Visit: Payer: Self-pay | Admitting: Family Medicine

## 2019-02-06 ENCOUNTER — Telehealth (INDEPENDENT_AMBULATORY_CARE_PROVIDER_SITE_OTHER): Payer: Self-pay | Admitting: Family Medicine

## 2019-02-06 ENCOUNTER — Other Ambulatory Visit: Payer: Self-pay

## 2019-02-06 DIAGNOSIS — J302 Other seasonal allergic rhinitis: Secondary | ICD-10-CM

## 2019-02-06 DIAGNOSIS — J301 Allergic rhinitis due to pollen: Secondary | ICD-10-CM

## 2019-02-06 MED ORDER — FLUTICASONE PROPIONATE 50 MCG/ACT NA SUSP: 2.0000 | Freq: Every day | NASAL | 6 refills | Status: DC

## 2019-02-06 NOTE — Progress Notes (Signed)
[facility] Telemedicine Visit  Patient consented to have virtual visit. Method of visit: Video  Encounter participants: Patient: Mary Hebert - located at home Provider: Wendee Beavers - located at office  Chief Complaint: chest congestion  HPI:  Mr. Pulido continues to have allergy symptoms including rhinorrhea, productive cough, and now new onset chest congestion over the last few days.  She recently had a telemedicine visit back on 4/22 with reports of generalized body aches which have now resolved.  She feels that the symptoms are very consistent with her seasonal allergies.  She denies any shortness of breath, chest pain, nausea or vomiting, fevers or chills, abdominal pain.  She has been taking her Zyrtec.  She is a non-smoker.  She also tried Benadryl yesterday evening and had improved sleep.  She currently lives at home with her husband and 1 of her sons.  Her husband has had similar symptoms.  Her other children have been out of the house since she was informed that several coworkers tested positive for COVID back on 01/24/2019.   ROS: per HPI  Pertinent PMHx: Reviewed  Exam:  General: pleasant, NAD with nontoxic appearance Respiratory: Speaking in complete sentences without signs of increased work of breathing HEENT: moist mucous membranes, clear rhinorrhea present  Assessment/Plan:  Seasonal allergies Difficult to ascertain if this is allergies versus viral infection.  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  Initially diagnosed with viral URI.  Well-appearing on exam.  No red flags.  Body aches now resolved and primarily improving URI symptoms with some chest congestion.  Patient does feel this is more consistent with her allergies. - Advised to continue Zyrtec and given Flonase with instructions to take 2 sprays in each nare daily and decrease to 1 spray in each nare for prophylaxis - Discussed other  conservative management including Tylenol, honey, humidifier - Reviewed return precautions including COVID symptoms    Time spent during visit with patient: 15 minutes

## 2019-02-06 NOTE — Assessment & Plan Note (Addendum)
Difficult to ascertain if this is allergies versus viral infection.  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  Initially diagnosed with viral URI.  Well-appearing on exam.  No red flags.  Body aches now resolved and primarily improving URI symptoms with some chest congestion.  Patient does feel this is more consistent with her allergies. - Advised to continue Zyrtec and given Flonase with instructions to take 2 sprays in each nare daily and decrease to 1 spray in each nare for prophylaxis - Discussed other conservative management including Tylenol, honey, humidifier - Reviewed return precautions including COVID symptoms

## 2019-06-13 ENCOUNTER — Ambulatory Visit (INDEPENDENT_AMBULATORY_CARE_PROVIDER_SITE_OTHER): Payer: Self-pay | Admitting: Family Medicine

## 2019-06-13 ENCOUNTER — Other Ambulatory Visit: Payer: Self-pay

## 2019-06-13 DIAGNOSIS — R21 Rash and other nonspecific skin eruption: Secondary | ICD-10-CM

## 2019-06-13 MED ORDER — PREDNISONE 10 MG PO TABS: ORAL_TABLET | ORAL | 0 refills | Status: DC

## 2019-06-13 MED ORDER — PREDNISONE 20 MG PO TABS: ORAL_TABLET | ORAL | 0 refills | Status: DC

## 2019-06-13 NOTE — Patient Instructions (Addendum)
I am sorry that you have been having so much trouble with this rash.  Again I think it is likely a contact dermatitis which means that he had allergic reaction to something he came into contact with.  Your symptoms are much more severe so we will treat you with systemic oral steroids.  I will give these to you in a tapered fashion.  You can take 40 mg for 5 days.  After that you will half the dose every 2 to 3 days until you get to 5 and you and.  I thought that the distractions in your prescription.

## 2019-06-16 DIAGNOSIS — R21 Rash and other nonspecific skin eruption: Secondary | ICD-10-CM | POA: Insufficient documentation

## 2019-06-16 NOTE — Assessment & Plan Note (Addendum)
Given patient's exposure and the presentation of her son likely exposure to poison ivy.  Patient has extensive presentation and has not responded to topical corticosteroids.  Will place patient on prednisone taper.  Patient to follow-up PRN if symptoms worsen.  Discussed possible side effects of prednisone.

## 2019-06-16 NOTE — Progress Notes (Signed)
   HPI 43 year old female who presents with extensive rash affecting essentially her entire body.  She states her symptoms for started about a week ago when her and her 2 sons went for a walk outside.  She believes they all had poison ivy exposure.  Her 2 sons had similar symptoms but of less severity.  She has tried over-the-counter hydrocortisone cream which did not really improve her symptoms very much.  She states that the rash is extremely pruritic and that she has had to constantly trim her fingernails to try and not damage her skin so much from itching.  She states that she has had almost no improvement and the rash is likely gotten worse.  She states that she wore longsleeve shirt and pants while outside.  She has had no other symptoms such as fever, upper respiratory symptoms.  CC: Rash   ROS:   Review of Systems See HPI for ROS.   CC, SH/smoking status, and VS noted  Objective: BP 104/64   Pulse 88   SpO2 57%  Gen: 43 year old [place] female, very pleasant, no acute distress HEENT: No cervical lymphadenopathy, EOMI, PERRLA a CV: The rate and rhythm, no M/R/G Resp: Lungs clear to auscultation bilaterally, no accessory muscle use Neuro: Alert and oriented, Speech clear, No gross deficits Derm: Vesicular rash noted in several areas with extensive excoriations.  2 areas on legs, one on each noted for central area with surrounding erythema.  These areas are intensely pruritic, not indurated, not warm             Assessment and plan:  Rash Given patient's exposure and the presentation of her son likely exposure to poison ivy.  Patient has extensive presentation and has not responded to topical corticosteroids.  Will place patient on prednisone taper.  Patient to follow-up PRN if symptoms worsen.  Discussed possible side effects of prednisone.   No orders of the defined types were placed in this encounter.   Meds ordered this encounter  Medications  . predniSONE  (DELTASONE) 20 MG tablet    Sig: Take 40mg  daily (2 tablets) for 5 days. Then decrease to 20mg  daily for 3 days.    Dispense:  13 tablet    Refill:  0  . predniSONE (DELTASONE) 10 MG tablet    Sig: Take 20 mg (2 tablets) daily for 3 days then take 10mg  (1 tablet) daily for 3 days, then take 5mg  (1/2 tablet) daily for 2 days    Dispense:  10 tablet    Refill:  0     Guadalupe Dawn MD PGY-3 Family Medicine Resident  06/16/2019 7:58 PM

## 2020-10-11 DIAGNOSIS — K602 Anal fissure, unspecified: Secondary | ICD-10-CM

## 2020-10-11 DIAGNOSIS — K648 Other hemorrhoids: Secondary | ICD-10-CM

## 2020-10-11 HISTORY — DX: Other hemorrhoids: K64.8

## 2020-10-11 HISTORY — DX: Anal fissure, unspecified: K60.2

## 2020-11-13 ENCOUNTER — Ambulatory Visit: Payer: Self-pay | Admitting: Nurse Practitioner

## 2020-11-15 HISTORY — PX: COLONOSCOPY: SHX174

## 2020-11-23 NOTE — Progress Notes (Signed)
Subjective:   Patient ID: Mary Hebert    DOB: 04-May-1976, 45 y.o. female   MRN: [id number]  Drenda R Kershaw is a 45 y.o. female with a history of seasonal allergies and obesity here for check up for citizenship.  Patient is here today for vaccinations and physical exam as she is applying for citizenship. She is originally from [place], moved here in 1995. She was last seen in 2019.   Social History: LMP: 11/13/20 Contraception: Tubal ligation Alcohol: occasional Tobacco: None Illicit Drugs: None Safe at home: Yes Depression/Suicidality: No  FMH: mother died at 11 for CAD PMH: seasonal allergies, obesity, gestational diabetes  Health Maintenance: Health Maintenance Due  Topic   Hepatitis C Screening     No family history of breast or colon cancer to suggest early screening.  Review of Systems:  Per HPI.   Objective:   BP 104/66    Pulse 61    Ht '5\' 4"'  (1.626 m)    Wt 167 lb (75.8 kg)    LMP 11/13/2020 (Exact Date)    SpO2 100%    BMI 28.67 kg/m  Vitals and nursing note reviewed.  General: pleasant middle aged woman, sitting comfortably in exam chair, well nourished, well developed, in no acute distress with non-toxic appearance HEENT: normocephalic, atraumatic, moist mucous membranes, oropharynx clear without erythema or exudate, TM normal bilaterally  CV: regular rate and rhythm without murmurs, rubs, or gallops, no lower extremity edema, 2+ radial and pedal pulses bilaterally Lungs: clear to auscultation bilaterally with normal work of breathing on room air, speaking in full sentences Abdomen: soft, non-tender, non-distended, no masses or organomegaly palpable, normoactive bowel sounds Skin: warm, dry Extremities: warm and well perfused, normal tone MSK:  gait normal Neuro: Alert and oriented, speech normal  Assessment & Plan:   Annual Physical Exam: Patient here today for physical exam and immunizations.  PMH, surgical history, and social history were  reviewed. The following concerns below were discussed.   Encounter for Immunizations:  Immunizations status reviewed. MMR, Varicella and Hep B serologies obtained. TDAP, Hep A and Flu given today. Patient to report to Health Department for Polio vaccine.  Blood pressure reviewed and at goal  Considered the following items based upon USPSTF recommendations: Diabetes screening: ordered Screening for elevated cholesterol: ordered HIV testing: Negative 2019 Hepatitis C: ordered Hepatitis B: ordered Syphilis if at high risk: Not ordered, negative 2019. Not at high risk. GC/CT not at high risk and not ordered. Reviewed risk factors for latent tuberculosis and undecided Reviewed risk factors for osteoporosis. Using FRAX tool estimated risk of major osteoporotic fracture of  2.4%, early screening not ordered   Discussed family history, BRCA testing not indicated. Tool used to risk stratify was Pedigree.  Cervical cancer screening: prior Pap reviewed, repeat due in 2024 Breast cancer screening: discussed potential benefits, risks including overdiagnosis and biopsy, elected to wait until age 20 Colorectal cancer screening: up to date on screening for CRC.   Follow up in 1 year or sooner if indicated.   No problem-specific Assessment & Plan notes found for this encounter.  Orders Placed This Encounter  Procedures   Hepatitis A vaccine adult IM   Tdap vaccine greater than or equal to 7yo IM   Flu Vaccine QUAD 36+ mos IM   Measles/Mumps/Rubella Immunity   Varicella Zoster Abs, IgG/IgM   Hepatitis B surface antigen   Hepatitis C antibody   Lipid Panel   Basic Metabolic Panel   Hepatitis B surface  antibody,quantitative   POCT glycosylated hemoglobin (Hb A1C)   No orders of the defined types were placed in this encounter.   Mina Marble, DO PGY-3, [facility] 11/26/2020 4:08 PM

## 2020-11-26 ENCOUNTER — Ambulatory Visit (INDEPENDENT_AMBULATORY_CARE_PROVIDER_SITE_OTHER): Payer: 59 | Admitting: Family Medicine

## 2020-11-26 ENCOUNTER — Ambulatory Visit: Payer: Self-pay | Admitting: Family Medicine

## 2020-11-26 ENCOUNTER — Encounter: Payer: Self-pay | Admitting: Family Medicine

## 2020-11-26 ENCOUNTER — Other Ambulatory Visit: Payer: Self-pay

## 2020-11-26 VITALS — BP 104/66 | HR 61 | Ht 64.0 in | Wt 167.0 lb

## 2020-11-26 DIAGNOSIS — Z0289 Encounter for other administrative examinations: Secondary | ICD-10-CM

## 2020-11-26 DIAGNOSIS — Z13220 Encounter for screening for lipoid disorders: Secondary | ICD-10-CM | POA: Diagnosis not present

## 2020-11-26 DIAGNOSIS — Z23 Encounter for immunization: Secondary | ICD-10-CM

## 2020-11-26 DIAGNOSIS — Z131 Encounter for screening for diabetes mellitus: Secondary | ICD-10-CM | POA: Diagnosis not present

## 2020-11-26 LAB — POCT GLYCOSYLATED HEMOGLOBIN (HGB A1C): Hemoglobin A1C: 5.2 % (ref 4.0–5.6)

## 2020-11-26 NOTE — Patient Instructions (Signed)
It was a pleasure to see you today!  Thank you for choosing [facility] for your primary care.   Our plans for today were:  I obtained labs today. I will call you with results.  You received some of your vaccines today, you need to schedule nurse visit for remaining vaccines. This visit is a very short visit. You will not need to see the doctor.  You will need to report to Health Department to obtain the adult Polio vaccine as our clinic does not have this.   You should return to our clinic in 1 year for annual exam.  Best Wishes,   Orpah Cobb, DO

## 2020-11-27 LAB — HEPATITIS C ANTIBODY: Hep C Virus Ab: <0.1 s/co ratio (ref 0.0–0.9)

## 2020-11-27 LAB — VARICELLA ZOSTER ABS, IGG/IGM
Varicella IgM: <0.91 index (ref 0.00–0.90)
Varicella zoster IgG: 616 index (ref >165)

## 2020-11-27 LAB — BASIC METABOLIC PANEL
BUN/Creatinine Ratio: 18 (ref 9–23)
BUN: 12 mg/dL (ref 6–24)
CO2: 23 mmol/L (ref 20–29)
Calcium: 9.4 mg/dL (ref 8.7–10.2)
Chloride: 103 mmol/L (ref 96–106)
Creatinine, Ser: 0.67 mg/dL (ref 0.57–1.00)
GFR calc Af Amer: 122 mL/min/{1.73_m2} (ref >59)
GFR calc non Af Amer: 106 mL/min/{1.73_m2} (ref >59)
Glucose: 87 mg/dL (ref 65–99)
Potassium: 4.3 mmol/L (ref 3.5–5.2)
Sodium: 139 mmol/L (ref 134–144)

## 2020-11-27 LAB — LIPID PANEL
Chol/HDL Ratio: 3.7 ratio (ref 0.0–4.4)
Cholesterol, Total: 191 mg/dL (ref 100–199)
HDL: 52 mg/dL (ref >39)
LDL Chol Calc (NIH): 124 mg/dL — ABNORMAL HIGH (ref 0–99)
Triglycerides: 82 mg/dL (ref 0–149)
VLDL Cholesterol Cal: 15 mg/dL (ref 5–40)

## 2020-11-27 LAB — MEASLES/MUMPS/RUBELLA IMMUNITY
MUMPS ABS, IGG: 63.2 AU/mL (ref >10.9)
RUBEOLA AB, IGG: 31 AU/mL (ref >16.4)
Rubella Antibodies, IGG: 13.1 index (ref >0.99)

## 2020-11-27 LAB — HEPATITIS B SURFACE ANTIBODY, QUANTITATIVE: Hepatitis B Surf Ab Quant: >1000 m[IU]/mL (ref >9.9)

## 2020-11-27 LAB — HEPATITIS B SURFACE ANTIGEN: Hepatitis B Surface Ag: NEGATIVE

## 2021-01-25 NOTE — Progress Notes (Deleted)
   Subjective:   Patient ID: Mary Hebert    DOB: 1975/10/31, 45 y.o. female   MRN: [id number]  Mary Hebert is a 45 y.o. female with a history of *** here for ***  Review of Systems:  Per HPI.   Objective:   There were no vitals taken for this visit. Vitals and nursing note reviewed.  General: pleasant ***, sitting comfortably in exam chair, well nourished, well developed, in no acute distress with non-toxic appearance HEENT: normocephalic, atraumatic, moist mucous membranes, oropharynx clear without erythema or exudate, TM normal bilaterally  Neck: supple, non-tender without lymphadenopathy CV: regular rate and rhythm without murmurs, rubs, or gallops, no lower extremity edema, 2+ radial and pedal pulses bilaterally Lungs: clear to auscultation bilaterally with normal work of breathing on room air Resp: breathing comfortably on room air, speaking in full sentences Abdomen: soft, non-tender, non-distended, no masses or organomegaly palpable, normoactive bowel sounds Skin: warm, dry, no rashes or lesions Extremities: warm and well perfused, normal tone MSK: ROM grossly intact, strength intact, gait normal Neuro: Alert and oriented, speech normal  Assessment & Plan:   No problem-specific Assessment & Plan notes found for this encounter.  No orders of the defined types were placed in this encounter.  No orders of the defined types were placed in this encounter.   {    This will disappear when note is signed, click to select method of visit    :1}  Orpah Cobb, DO PGY-3, [facility] 01/25/2021 9:30 AM

## 2021-01-27 ENCOUNTER — Other Ambulatory Visit: Payer: Self-pay | Admitting: Family Medicine

## 2021-01-27 ENCOUNTER — Ambulatory Visit: Payer: 59 | Admitting: Family Medicine

## 2021-01-27 ENCOUNTER — Other Ambulatory Visit: Payer: Self-pay

## 2021-01-27 DIAGNOSIS — J302 Other seasonal allergic rhinitis: Secondary | ICD-10-CM

## 2021-01-27 MED ORDER — CETIRIZINE HCL 10 MG PO TABS
10.0000 mg | ORAL_TABLET | Freq: Every day | ORAL | 11 refills | Status: DC
Start: 2021-01-27 — End: 2021-09-02

## 2021-07-31 HISTORY — PX: SIGMOIDOSCOPY: SUR1295

## 2021-08-24 ENCOUNTER — Encounter (HOSPITAL_COMMUNITY): Payer: Self-pay

## 2021-08-24 ENCOUNTER — Ambulatory Visit (HOSPITAL_COMMUNITY)
Admission: EM | Admit: 2021-08-24 | Discharge: 2021-08-24 | Disposition: A | Discharge status: Home / Self Care | Payer: 59 | Attending: Emergency Medicine | Admitting: Emergency Medicine

## 2021-08-24 ENCOUNTER — Other Ambulatory Visit: Payer: Self-pay

## 2021-08-24 DIAGNOSIS — K648 Other hemorrhoids: Secondary | ICD-10-CM | POA: Diagnosis not present

## 2021-08-24 MED ORDER — LIDOCAINE 5 % EX OINT: 1.0000 "application " | TOPICAL_OINTMENT | CUTANEOUS | 0 refills | Status: DC | PRN

## 2021-08-24 MED ORDER — KETOROLAC TROMETHAMINE 30 MG/ML IJ SOLN
INTRAMUSCULAR | Status: AC
  Filled 2021-08-24: qty 1

## 2021-08-24 MED ORDER — KETOROLAC TROMETHAMINE 30 MG/ML IJ SOLN
30.0000 mg | Freq: Once | INTRAMUSCULAR | Status: AC
  Administered 2021-08-24: 30 mg via INTRAMUSCULAR

## 2021-08-24 MED ORDER — HYDROCORTISONE (PERIANAL) 2.5 % EX CREA: 1.0000 "application " | TOPICAL_CREAM | Freq: Two times a day (BID) | CUTANEOUS | 0 refills | Status: DC

## 2021-08-24 NOTE — Discharge Instructions (Signed)
You may apply hydrocortisone cream to your anus twice a day  You may apply lidocaine cream to the anus at least 4 times a day  Reapply creams as necessary  Continue to keep contact with your surgeon for follow-up and keep your upcoming appointment on the 29th as surgical intervention is the treatment for your anal fissure fissure

## 2021-08-24 NOTE — ED Triage Notes (Signed)
Pt reports rectal pain x 1 month. States she saw blood in the toilet yesterday. States she was told she have a anal fissure and was referred to colon surgeon, pt is waiting for apt on 09/08/21. States she was prescribed Hydrocort-Pramoxine, was not able to buy it due to price.

## 2021-08-24 NOTE — ED Provider Notes (Signed)
[facility]    CSN: [id number] Arrival date & time: 08/24/21  1448      History   Chief Complaint Chief Complaint  Patient presents with   Rectal Pain    HPI Mary Hebert is a 45 y.o. female.   Patient presents with rectal pain and rectal bleeding for 1 month. Endorses she saw blood in the toilet yesterday. Followed by GI, Had sigmoidoscopy on 10/21 which confirmed internal hemorrhoids and anal fissure, is awaiting appointment on 11/29 with surgeon for evaluation. Was notified by office that they would appointment up if cancellation occurs.  Has attempted use of prep H, sitz bath, tramadol, otc pain medications, nitroglycerine cream with no relief. Endorse regular bowel movement. Was seen provider at her place of work who prescribed cream, unable to afford. Denies nausea, vomiting, diarrhea, fever, chills.   Past Medical History:  Diagnosis Date   Gestational diabetes mellitus    with 2nd child, did not have with children #3 and 4    Patient Active Problem List   Diagnosis Date Noted   Seasonal allergies 02/08/2018   OBESITY, NOS 12/08/2006    Past Surgical History:  Procedure Laterality Date   TUBAL LIGATION      OB History   No obstetric history on file.      Home Medications    Prior to Admission medications   Medication Sig Start Date End Date Taking? Authorizing Provider  hydrocortisone (ANUSOL-HC) 2.5 % rectal cream Place 1 application rectally 2 (two) times daily. 08/24/21  Yes Aaminah Forrester R, NP  lidocaine (XYLOCAINE) 5 % ointment Apply 1 application topically as needed. 08/24/21  Yes Kable Haywood, Elita Boone, NP  cetirizine (ZYRTEC) 10 MG tablet Take 1 tablet (10 mg total) by mouth daily. 01/27/21   Mullis, Kiersten P, DO  cetirizine-pseudoephedrine (ZYRTEC-D) 5-120 MG tablet Take 1 tablet by mouth daily. 02/24/18   Wurst, [place], PA-C  fluticasone (FLONASE) 50 MCG/ACT nasal spray Place 2 sprays into both nostrils daily. 02/06/19   Durward Parcel, DO    Family History Family History  Problem Relation Age of Onset   Cancer Maternal Grandmother     Social History Social History   Tobacco Use   Smoking status: Never   Smokeless tobacco: Never  Substance Use Topics   Alcohol use: Yes    Alcohol/week: 12.0 standard drinks    Types: 12 Cans of beer per week    Comment: drinks socially, qweek to [employer], usually drnks >12 beers per social encounter    Drug use: No     Allergies   Patient has no known allergies.   Review of Systems Review of Systems  Constitutional: Negative.   Cardiovascular: Negative.   Gastrointestinal: Negative.   Genitourinary: Negative.   Skin: Negative.   Neurological: Negative.     Physical Exam Triage Vital Signs ED Triage Vitals  Enc Vitals Group     BP 08/24/21 1531 112/66     Pulse Rate 08/24/21 1531 91     Resp 08/24/21 1531 18     Temp 08/24/21 1531 98.4 F (36.9 C)     Temp Source 08/24/21 1531 Oral     SpO2 08/24/21 1531 98 %     Weight --      Height --      Head Circumference --      Peak Flow --      Pain Score 08/24/21 1530 10     Pain Loc --  Pain Edu? --      Excl. in GC? --    No data found.  Updated Vital Signs BP 112/66 (BP Location: Right Arm)   Pulse 91   Temp 98.4 F (36.9 C) (Oral)   Resp 18   LMP 08/11/2021 (Exact Date)   SpO2 98%   Visual Acuity Right Eye Distance:   Left Eye Distance:   Bilateral Distance:    Right Eye Near:   Left Eye Near:    Bilateral Near:     Physical Exam Constitutional:      Appearance: Normal appearance. She is normal weight.  HENT:     Head: Normocephalic.  Eyes:     Extraocular Movements: Extraocular movements intact.  Pulmonary:     Effort: Pulmonary effort is normal.  Abdominal:     Hernia: No hernia is present.  Genitourinary:    Rectum: External hemorrhoid present.  Skin:    General: Skin is warm and dry.  Neurological:     General: No focal deficit present.     Mental Status: She is  alert and oriented to person, place, and time. Mental status is at baseline.  Psychiatric:        Mood and Affect: Mood normal.        Behavior: Behavior normal.     UC Treatments / Results  Labs (all labs ordered are listed, but only abnormal results are displayed) Labs Reviewed - No data to display  EKG   Radiology No results found.  Procedures Procedures (including critical care time)  Medications Ordered in UC Medications  ketorolac (TORADOL) 30 MG/ML injection 30 mg (30 mg Intramuscular Given 08/24/21 1551)    Initial Impression / Assessment and Plan / UC Course  I have reviewed the triage vital signs and the nursing notes.  Pertinent labs & imaging results that were available during my care of the patient were reviewed by me and considered in my medical decision making (see chart for details).  Internal hemorrhoids with complication  External hemorrhoid seen on exam, discussed finding with patient, she endorses this is a skin tag. Per sigmoidoscopy report hemorrhoid and anal fissure are internal, discussed this with patient, endorses that she has tramadol at home, will change topical treatment, hydrocortisone 2.5 % bid and lidocaine 5% cream as needed prescribed, Toradol 30 IM now, encouraged patient to keep upcoming appointment with surgeon, work note given  Final Clinical Impressions(s) / UC Diagnoses   Final diagnoses:  Internal hemorrhoids with complication     Discharge Instructions      You may apply hydrocortisone cream to your anus twice a day  You may apply lidocaine cream to the anus at least 4 times a day  Reapply creams as necessary  Continue to keep contact with your surgeon for follow-up and keep your upcoming appointment on the 29th as surgical intervention is the treatment for your anal fissure fissure   ED Prescriptions     Medication Sig Dispense Auth. Provider   hydrocortisone (ANUSOL-HC) 2.5 % rectal cream Place 1 application rectally  2 (two) times daily. 30 g Odie Edmonds R, NP   lidocaine (XYLOCAINE) 5 % ointment Apply 1 application topically as needed. 35.44 g Valinda Hoar, NP      PDMP not reviewed this encounter.   Valinda Hoar, NP 08/24/21 1659

## 2021-08-31 ENCOUNTER — Ambulatory Visit: Payer: Self-pay | Admitting: General Surgery

## 2021-08-31 NOTE — H&P (Signed)
REFERRING PHYSICIAN:  Eleanora Neighbor, MD  PROVIDER:  Elenora Gamma, MD  MRN: [id number] DOB: September 15, 1976 DATE OF ENCOUNTER: 08/31/2021  Subjective   Chief Complaint: Rectal Pain     History of Present Illness: Mary Hebert is a 45 y.o. female who is seen today as an office consultation at the request of Dr. Cloretta Ned for evaluation of Rectal Pain .   45 year old female who presents to the office for evaluation of anal pain.  This has been intermittent for the past 9 months but became rather constant over the past couple months.  She was placed on diltiazem ointment which she has been using for the past 4 weeks.  She continues to have pain with bowel movements.  This will last for several hours afterwards making it difficult for her to sit or do her job.  She has had a colonoscopy recently as well, which showed some internal hemorrhoids which were banded.  She then underwent a flexible sigmoidoscopy and late October which showed a posterior anal fissure and hypertrophied anal papilla.   Review of Systems: A complete review of systems was obtained from the patient.  I have reviewed this information and discussed as appropriate with the patient.  See HPI as well for other ROS.    Medical History: History reviewed. No pertinent past medical history.  There is no problem list on file for this patient.   Past Surgical History:  Procedure Laterality Date   LAPAROSCOPIC TUBAL LIGATION       No Known Allergies  Current Outpatient Medications on File Prior to Visit  Medication Sig Dispense Refill   traMADoL (ULTRAM) 50 mg tablet Take 1 tablet (50 mg total) by mouth 4 (four) times daily as needed     No current facility-administered medications on file prior to visit.    History reviewed. No pertinent family history.   Social History   Tobacco Use  Smoking Status Never  Smokeless Tobacco Never     Social History   Socioeconomic History   Marital status:  Single  Tobacco Use   Smoking status: Never   Smokeless tobacco: Never  Vaping Use   Vaping Use: Never used  Substance and Sexual Activity   Alcohol use: Yes   Drug use: Never    Objective:    Vitals:   08/31/21 1318  BP: 122/78  Pulse: 109  Temp: 37.1 C (98.7 F)  SpO2: 99%  Weight: 79.6 kg (175 lb 6.4 oz)  Height: 165.1 cm (5\' 5" )     Exam Gen: NAD CV: RRR Lungs: CTA Abd: soft Rectal: significant tenderness, posterior skin tag.  Sphincter htn   Labs, Imaging and Diagnostic Testing:   Assessment and Plan:  Diagnoses and all orders for this visit:  Anal fissure  I cannot directly visualize an anal fissure on exam today due mainly to her large posterior skin tag and pain, but this seems most consistent with her symptoms and previous examinations.  She has not improved with medical management.  I recommend exam under anesthesia with possible chemical sphincterotomy and posterior skin tag excision.  We have discussed this in detail.  We discussed the risk and benefits of chemical sphincterotomy and this is mostly a risk of recurrence as the Botox will wear off in 6 to 8 weeks.  We also discussed the alternative of lateral internal sphincterotomy and the 20% risk of incontinence with this procedure.  All questions were answered.  Patient would like to proceed with surgery soon  as possible.

## 2021-08-31 NOTE — H&P (View-Only) (Signed)
REFERRING PHYSICIAN:  Eleanora Neighbor, MD  PROVIDER:  Elenora Gamma, MD  MRN: [id number] DOB: September 15, 1976 DATE OF ENCOUNTER: 08/31/2021  Subjective   Chief Complaint: Rectal Pain     History of Present Illness: Mary Hebert is a 45 y.o. female who is seen today as an office consultation at the request of Dr. Cloretta Ned for evaluation of Rectal Pain .   45 year old female who presents to the office for evaluation of anal pain.  This has been intermittent for the past 9 months but became rather constant over the past couple months.  She was placed on diltiazem ointment which she has been using for the past 4 weeks.  She continues to have pain with bowel movements.  This will last for several hours afterwards making it difficult for her to sit or do her job.  She has had a colonoscopy recently as well, which showed some internal hemorrhoids which were banded.  She then underwent a flexible sigmoidoscopy and late October which showed a posterior anal fissure and hypertrophied anal papilla.   Review of Systems: A complete review of systems was obtained from the patient.  I have reviewed this information and discussed as appropriate with the patient.  See HPI as well for other ROS.    Medical History: History reviewed. No pertinent past medical history.  There is no problem list on file for this patient.   Past Surgical History:  Procedure Laterality Date   LAPAROSCOPIC TUBAL LIGATION       No Known Allergies  Current Outpatient Medications on File Prior to Visit  Medication Sig Dispense Refill   traMADoL (ULTRAM) 50 mg tablet Take 1 tablet (50 mg total) by mouth 4 (four) times daily as needed     No current facility-administered medications on file prior to visit.    History reviewed. No pertinent family history.   Social History   Tobacco Use  Smoking Status Never  Smokeless Tobacco Never     Social History   Socioeconomic History   Marital status:  Single  Tobacco Use   Smoking status: Never   Smokeless tobacco: Never  Vaping Use   Vaping Use: Never used  Substance and Sexual Activity   Alcohol use: Yes   Drug use: Never    Objective:    Vitals:   08/31/21 1318  BP: 122/78  Pulse: 109  Temp: 37.1 C (98.7 F)  SpO2: 99%  Weight: 79.6 kg (175 lb 6.4 oz)  Height: 165.1 cm (5\' 5" )     Exam Gen: NAD CV: RRR Lungs: CTA Abd: soft Rectal: significant tenderness, posterior skin tag.  Sphincter htn   Labs, Imaging and Diagnostic Testing:   Assessment and Plan:  Diagnoses and all orders for this visit:  Anal fissure  I cannot directly visualize an anal fissure on exam today due mainly to her large posterior skin tag and pain, but this seems most consistent with her symptoms and previous examinations.  She has not improved with medical management.  I recommend exam under anesthesia with possible chemical sphincterotomy and posterior skin tag excision.  We have discussed this in detail.  We discussed the risk and benefits of chemical sphincterotomy and this is mostly a risk of recurrence as the Botox will wear off in 6 to 8 weeks.  We also discussed the alternative of lateral internal sphincterotomy and the 20% risk of incontinence with this procedure.  All questions were answered.  Patient would like to proceed with surgery soon  as possible.

## 2021-09-02 ENCOUNTER — Other Ambulatory Visit: Payer: Self-pay

## 2021-09-02 ENCOUNTER — Encounter (HOSPITAL_BASED_OUTPATIENT_CLINIC_OR_DEPARTMENT_OTHER): Payer: Self-pay | Admitting: General Surgery

## 2021-09-02 NOTE — Progress Notes (Signed)
Spoke w/ via phone for pre-op interview---pt Lab needs dos---- urine POCT              Lab results------none COVID test -----patient states asymptomatic no test needed Arrive at -------1230 on 09/10/21 NPO after MN NO Solid Food.  Clear liquids from MN until---1130 Med rec completed Medications to take morning of surgery -----Tramadol prn Diabetic medication -----n/a Patient instructed no nail polish to be worn day of surgery Patient instructed to bring photo id and insurance card day of surgery Patient aware to have Driver (ride ) / caregiver    for 24 hours after surgery -husband Jeral Pinch Patient Special Instructions -----none Pre-Op special Istructions -----none Patient verbalized understanding of instructions that were given at this phone interview. Patient denies shortness of breath, chest pain, fever, cough at this phone interview.

## 2021-09-10 ENCOUNTER — Ambulatory Visit (HOSPITAL_BASED_OUTPATIENT_CLINIC_OR_DEPARTMENT_OTHER): Payer: 59 | Admitting: Anesthesiology

## 2021-09-10 ENCOUNTER — Encounter (HOSPITAL_BASED_OUTPATIENT_CLINIC_OR_DEPARTMENT_OTHER)
Admission: RE | Disposition: A | Discharge status: Home / Self Care | Payer: Self-pay | Source: Ambulatory Visit | Attending: General Surgery

## 2021-09-10 ENCOUNTER — Encounter (HOSPITAL_BASED_OUTPATIENT_CLINIC_OR_DEPARTMENT_OTHER): Payer: Self-pay | Admitting: General Surgery

## 2021-09-10 ENCOUNTER — Ambulatory Visit (HOSPITAL_BASED_OUTPATIENT_CLINIC_OR_DEPARTMENT_OTHER)
Admission: RE | Admit: 2021-09-10 | Discharge: 2021-09-10 | Disposition: A | Discharge status: Home / Self Care | Payer: 59 | Source: Ambulatory Visit | Attending: General Surgery | Admitting: General Surgery

## 2021-09-10 DIAGNOSIS — K6289 Other specified diseases of anus and rectum: Secondary | ICD-10-CM | POA: Diagnosis present

## 2021-09-10 DIAGNOSIS — K620 Anal polyp: Secondary | ICD-10-CM | POA: Diagnosis not present

## 2021-09-10 DIAGNOSIS — Z01818 Encounter for other preprocedural examination: Secondary | ICD-10-CM

## 2021-09-10 DIAGNOSIS — K602 Anal fissure, unspecified: Secondary | ICD-10-CM | POA: Diagnosis not present

## 2021-09-10 DIAGNOSIS — K644 Residual hemorrhoidal skin tags: Secondary | ICD-10-CM | POA: Insufficient documentation

## 2021-09-10 HISTORY — PX: RECTAL EXAM UNDER ANESTHESIA: SHX6399

## 2021-09-10 HISTORY — PX: SPHINCTEROTOMY: SHX5279

## 2021-09-10 HISTORY — PX: EXCISION OF SKIN TAG: SHX6270

## 2021-09-10 HISTORY — DX: Presence of spectacles and contact lenses: Z97.3

## 2021-09-10 LAB — POCT PREGNANCY, URINE: Preg Test, Ur: NEGATIVE

## 2021-09-10 SURGERY — EXAM UNDER ANESTHESIA, RECTUM
Anesthesia: Monitor Anesthesia Care | Site: Rectum

## 2021-09-10 MED ORDER — PROPOFOL 10 MG/ML IV BOLUS
INTRAVENOUS | Status: DC | PRN
  Administered 2021-09-10 (×4): 20 mg via INTRAVENOUS

## 2021-09-10 MED ORDER — ACETAMINOPHEN 500 MG PO TABS
1000.0000 mg | ORAL_TABLET | Freq: Once | ORAL | Status: AC
  Administered 2021-09-10: 1000 mg via ORAL

## 2021-09-10 MED ORDER — CELECOXIB 200 MG PO CAPS
ORAL_CAPSULE | ORAL | Status: AC
  Filled 2021-09-10: qty 1

## 2021-09-10 MED ORDER — MIDAZOLAM HCL 2 MG/2ML IJ SOLN
INTRAMUSCULAR | Status: DC | PRN
  Administered 2021-09-10: 2 mg via INTRAVENOUS

## 2021-09-10 MED ORDER — PROPOFOL 500 MG/50ML IV EMUL: INTRAVENOUS | Status: DC | PRN

## 2021-09-10 MED ORDER — PROMETHAZINE HCL 25 MG/ML IJ SOLN: 6.2500 mg | INTRAMUSCULAR | Status: DC | PRN

## 2021-09-10 MED ORDER — OXYCODONE HCL 5 MG PO TABS: 5.0000 mg | ORAL_TABLET | Freq: Once | ORAL | Status: DC | PRN

## 2021-09-10 MED ORDER — KETOROLAC TROMETHAMINE 30 MG/ML IJ SOLN: 30.0000 mg | Freq: Once | INTRAMUSCULAR | Status: DC | PRN

## 2021-09-10 MED ORDER — KETAMINE HCL 10 MG/ML IJ SOLN
INTRAMUSCULAR | Status: DC | PRN
  Administered 2021-09-10 (×2): 10 mg via INTRAVENOUS

## 2021-09-10 MED ORDER — ONABOTULINUMTOXINA 100 UNITS IJ SOLR
INTRAMUSCULAR | Status: DC | PRN
  Administered 2021-09-10: 100 [IU] via INTRAMUSCULAR

## 2021-09-10 MED ORDER — ACETAMINOPHEN 500 MG PO TABS: 1000.0000 mg | ORAL_TABLET | ORAL | Status: DC

## 2021-09-10 MED ORDER — GABAPENTIN 300 MG PO CAPS
ORAL_CAPSULE | ORAL | Status: AC
  Filled 2021-09-10: qty 1

## 2021-09-10 MED ORDER — LIDOCAINE HCL (CARDIAC) PF 100 MG/5ML IV SOSY
PREFILLED_SYRINGE | INTRAVENOUS | Status: DC | PRN
  Administered 2021-09-10: 20 mg via INTRAVENOUS

## 2021-09-10 MED ORDER — MIDAZOLAM HCL 2 MG/2ML IJ SOLN
INTRAMUSCULAR | Status: AC
  Filled 2021-09-10: qty 2

## 2021-09-10 MED ORDER — FENTANYL CITRATE (PF) 100 MCG/2ML IJ SOLN
INTRAMUSCULAR | Status: DC | PRN
  Administered 2021-09-10: 25 ug via INTRAVENOUS

## 2021-09-10 MED ORDER — SODIUM CHLORIDE 0.9% FLUSH: 3.0000 mL | Freq: Two times a day (BID) | INTRAVENOUS | Status: DC

## 2021-09-10 MED ORDER — PROPOFOL 10 MG/ML IV BOLUS
INTRAVENOUS | Status: AC
  Filled 2021-09-10: qty 20

## 2021-09-10 MED ORDER — MEPERIDINE HCL 25 MG/ML IJ SOLN: 6.2500 mg | INTRAMUSCULAR | Status: DC | PRN

## 2021-09-10 MED ORDER — GABAPENTIN 300 MG PO CAPS
300.0000 mg | ORAL_CAPSULE | ORAL | Status: AC
  Administered 2021-09-10: 300 mg via ORAL

## 2021-09-10 MED ORDER — PROPOFOL 500 MG/50ML IV EMUL
INTRAVENOUS | Status: DC | PRN
  Administered 2021-09-10: 50 ug/kg/min via INTRAVENOUS

## 2021-09-10 MED ORDER — LACTATED RINGERS IV SOLN: INTRAVENOUS | Status: DC

## 2021-09-10 MED ORDER — TRAMADOL HCL 50 MG PO TABS: 50.0000 mg | ORAL_TABLET | Freq: Four times a day (QID) | ORAL | 0 refills | Status: DC | PRN

## 2021-09-10 MED ORDER — CELECOXIB 200 MG PO CAPS
200.0000 mg | ORAL_CAPSULE | ORAL | Status: AC
  Administered 2021-09-10: 200 mg via ORAL

## 2021-09-10 MED ORDER — 0.9 % SODIUM CHLORIDE (POUR BTL) OPTIME
TOPICAL | Status: DC | PRN
  Administered 2021-09-10: 500 mL

## 2021-09-10 MED ORDER — AMISULPRIDE (ANTIEMETIC) 5 MG/2ML IV SOLN: 10.0000 mg | Freq: Once | INTRAVENOUS | Status: DC | PRN

## 2021-09-10 MED ORDER — DEXAMETHASONE SODIUM PHOSPHATE 10 MG/ML IJ SOLN
INTRAMUSCULAR | Status: DC | PRN
  Administered 2021-09-10: 8 mg via INTRAVENOUS

## 2021-09-10 MED ORDER — FENTANYL CITRATE (PF) 100 MCG/2ML IJ SOLN
INTRAMUSCULAR | Status: AC
  Filled 2021-09-10: qty 2

## 2021-09-10 MED ORDER — OXYCODONE HCL 5 MG/5ML PO SOLN: 5.0000 mg | Freq: Once | ORAL | Status: DC | PRN

## 2021-09-10 MED ORDER — ONDANSETRON HCL 4 MG/2ML IJ SOLN
INTRAMUSCULAR | Status: DC | PRN
  Administered 2021-09-10: 4 mg via INTRAVENOUS

## 2021-09-10 MED ORDER — SODIUM CHLORIDE (PF) 0.9 % IJ SOLN
INTRAMUSCULAR | Status: DC | PRN
  Administered 2021-09-10: 25 mL

## 2021-09-10 MED ORDER — KETAMINE HCL 50 MG/5ML IJ SOSY
PREFILLED_SYRINGE | INTRAMUSCULAR | Status: AC
  Filled 2021-09-10: qty 5

## 2021-09-10 MED ORDER — BUPIVACAINE-EPINEPHRINE 0.5% -1:200000 IJ SOLN
INTRAMUSCULAR | Status: DC | PRN
  Administered 2021-09-10: 30 mL

## 2021-09-10 MED ORDER — HYDROMORPHONE HCL 1 MG/ML IJ SOLN: 0.2500 mg | INTRAMUSCULAR | Status: DC | PRN

## 2021-09-10 MED ORDER — ACETAMINOPHEN 500 MG PO TABS
ORAL_TABLET | ORAL | Status: AC
  Filled 2021-09-10: qty 2

## 2021-09-10 SURGICAL SUPPLY — 68 items
ADH SKN CLS APL DERMABOND .7 (GAUZE/BANDAGES/DRESSINGS)
APL PRP STRL LF DISP 70% ISPRP (MISCELLANEOUS)
APL SKNCLS STERI-STRIP NONHPOA (GAUZE/BANDAGES/DRESSINGS) ×1
BAG DECANTER FOR FLEXI CONT (MISCELLANEOUS) ×1 IMPLANT
BENZOIN TINCTURE PRP APPL 2/3 (GAUZE/BANDAGES/DRESSINGS) ×3 IMPLANT
BLADE CLIPPER SENSICLIP SURGIC (BLADE) IMPLANT
BLADE EXTENDED COATED 6.5IN (ELECTRODE) IMPLANT
BLADE SURG 10 STRL SS (BLADE) IMPLANT
BLADE SURG 15 STRL LF DISP TIS (BLADE) ×1 IMPLANT
BLADE SURG 15 STRL SS (BLADE) ×2
CHLORAPREP W/TINT 26 (MISCELLANEOUS) IMPLANT
COVER BACK TABLE 60X90IN (DRAPES) ×2 IMPLANT
COVER MAYO STAND STRL (DRAPES) ×2 IMPLANT
DECANTER SPIKE VIAL GLASS SM (MISCELLANEOUS) ×1 IMPLANT
DERMABOND ADVANCED (GAUZE/BANDAGES/DRESSINGS)
DERMABOND ADVANCED .7 DNX12 (GAUZE/BANDAGES/DRESSINGS) ×1 IMPLANT
DRAPE HYSTEROSCOPY (MISCELLANEOUS) IMPLANT
DRAPE LAPAROTOMY 100X72 PEDS (DRAPES) ×2 IMPLANT
DRAPE SHEET LG 3/4 BI-LAMINATE (DRAPES) IMPLANT
DRAPE UTILITY XL STRL (DRAPES) ×2 IMPLANT
DRSG PAD ABDOMINAL 8X10 ST (GAUZE/BANDAGES/DRESSINGS) ×2 IMPLANT
DRSG TEGADERM 4X4.75 (GAUZE/BANDAGES/DRESSINGS) IMPLANT
ELECT REM PT RETURN 9FT ADLT (ELECTROSURGICAL) ×2
ELECTRODE REM PT RTRN 9FT ADLT (ELECTROSURGICAL) ×1 IMPLANT
GAUZE 4X4 16PLY ~~LOC~~+RFID DBL (SPONGE) ×2 IMPLANT
GAUZE SPONGE 4X4 12PLY STRL (GAUZE/BANDAGES/DRESSINGS) ×2 IMPLANT
GLOVE SURG ENC MOIS LTX SZ6.5 (GLOVE) ×2 IMPLANT
GLOVE SURG UNDER LTX SZ6.5 (GLOVE) ×2 IMPLANT
GOWN STRL REUS W/TWL XL LVL3 (GOWN DISPOSABLE) ×2 IMPLANT
HYDROGEN PEROXIDE 16OZ (MISCELLANEOUS) ×1 IMPLANT
IV CATH 14GX2 1/4 (CATHETERS) ×1 IMPLANT
IV CATH 18G SAFETY (IV SOLUTION) ×1 IMPLANT
KIT SIGMOIDOSCOPE (SET/KITS/TRAYS/PACK) IMPLANT
KIT TURNOVER CYSTO (KITS) ×2 IMPLANT
LEGGING LITHOTOMY PAIR STRL (DRAPES) IMPLANT
LOOP VESSEL MAXI BLUE (MISCELLANEOUS) IMPLANT
NDL SAFETY ECLIPSE 18X1.5 (NEEDLE) IMPLANT
NEEDLE HYPO 18GX1.5 SHARP (NEEDLE) ×2
NEEDLE HYPO 22GX1.5 SAFETY (NEEDLE) ×2 IMPLANT
NS IRRIG 500ML POUR BTL (IV SOLUTION) ×2 IMPLANT
PACK BASIN DAY SURGERY FS (CUSTOM PROCEDURE TRAY) ×2 IMPLANT
PAD ARMBOARD 7.5X6 YLW CONV (MISCELLANEOUS) IMPLANT
PANTS MESH DISP LRG (UNDERPADS AND DIAPERS) ×1 IMPLANT
PANTS MESH DISPOSABLE L (UNDERPADS AND DIAPERS) ×1
PENCIL SMOKE EVACUATOR (MISCELLANEOUS) ×2 IMPLANT
SOL PREP POV-IOD 4OZ 10% (MISCELLANEOUS) ×1 IMPLANT
SPONGE HEMORRHOID 8X3CM (HEMOSTASIS) IMPLANT
SPONGE SURGIFOAM ABS GEL 12-7 (HEMOSTASIS) IMPLANT
STRIP CLOSURE SKIN 1/2X4 (GAUZE/BANDAGES/DRESSINGS) IMPLANT
SUCTION FRAZIER HANDLE 10FR (MISCELLANEOUS)
SUCTION TUBE FRAZIER 10FR DISP (MISCELLANEOUS) IMPLANT
SUT CHROMIC 2 0 SH (SUTURE) ×1 IMPLANT
SUT CHROMIC 3 0 SH 27 (SUTURE) IMPLANT
SUT ETHIBOND 0 (SUTURE) IMPLANT
SUT VIC AB 2-0 SH 27 (SUTURE)
SUT VIC AB 2-0 SH 27XBRD (SUTURE) IMPLANT
SUT VIC AB 3-0 SH 18 (SUTURE) IMPLANT
SUT VIC AB 3-0 SH 27 (SUTURE)
SUT VIC AB 3-0 SH 27XBRD (SUTURE) IMPLANT
SYR CONTROL 10ML LL (SYRINGE) ×3 IMPLANT
TOWEL OR 17X26 10 PK STRL BLUE (TOWEL DISPOSABLE) ×2 IMPLANT
TRAP FLUID SMOKE EVACUATOR (MISCELLANEOUS) ×1 IMPLANT
TRAY DSU PREP LF (CUSTOM PROCEDURE TRAY) ×2 IMPLANT
TUBE CONNECTING 12X1/4 (SUCTIONS) ×2 IMPLANT
UNDERPAD 30X36 HEAVY ABSORB (UNDERPADS AND DIAPERS) ×2 IMPLANT
WATER STERILE IRR 1000ML POUR (IV SOLUTION) ×1 IMPLANT
WATER STERILE IRR 500ML POUR (IV SOLUTION) ×1 IMPLANT
YANKAUER SUCT BULB TIP NO VENT (SUCTIONS) ×2 IMPLANT

## 2021-09-10 NOTE — Anesthesia Postprocedure Evaluation (Signed)
Anesthesia Post Note  Patient: Mary Hebert  Procedure(s) Performed: ANAL EXAM UNDER ANESTHESIA (Rectum) EXCISION OF SKIN TAG (Rectum) CHEMICAL SPHINCTEROTOMY (Rectum)     Patient location during evaluation: PACU Anesthesia Type: MAC Level of consciousness: awake and alert Pain management: pain level controlled Vital Signs Assessment: post-procedure vital signs reviewed and stable Respiratory status: spontaneous breathing, nonlabored ventilation and respiratory function stable Cardiovascular status: blood pressure returned to baseline and stable Postop Assessment: no apparent nausea or vomiting Anesthetic complications: no   No notable events documented.  Last Vitals:  Vitals:   09/10/21 1330 09/10/21 1345  BP: 117/72 114/64  Pulse: (!) 107 86  Resp: 19 17  Temp: 36.4 C   SpO2: 100% 100%    Last Pain:  Vitals:   09/10/21 1330  TempSrc:   PainSc: 0-No pain                 Pervis Hocking

## 2021-09-10 NOTE — Transfer of Care (Signed)
Immediate Anesthesia Transfer of Care Note  Patient: Mary Hebert  Procedure(s) Performed: ANAL EXAM UNDER ANESTHESIA (Rectum) EXCISION OF SKIN TAG (Rectum) CHEMICAL SPHINCTEROTOMY (Rectum)  Patient Location: PACU  Anesthesia Type:MAC  Level of Consciousness: awake, alert , oriented and patient cooperative  Airway & Oxygen Therapy: Patient Spontanous Breathing  Post-op Assessment: Report given to RN and Post -op Vital signs reviewed and stable  Post vital signs: Reviewed and stable  Last Vitals:  Vitals Value Taken Time  BP 126/77 09/10/21 1327  Temp    Pulse 107 09/10/21 1330  Resp 19 09/10/21 1330  SpO2 100 % 09/10/21 1330  Vitals shown include unvalidated device data.  Last Pain:  Vitals:   09/10/21 1245  TempSrc: Oral  PainSc: 8       Patients Stated Pain Goal: 5 (62/69/48 5462)  Complications: No notable events documented.

## 2021-09-10 NOTE — Anesthesia Preprocedure Evaluation (Addendum)
Anesthesia Evaluation  Patient identified by MRN, date of birth, ID band Patient awake    Reviewed: Allergy & Precautions, NPO status , Patient's Chart, lab work & pertinent test results  Airway Mallampati: II  TM Distance: >3 FB Neck ROM: Full    Dental no notable dental hx.    Pulmonary neg pulmonary ROS,    Pulmonary exam normal breath sounds clear to auscultation       Cardiovascular negative cardio ROS Normal cardiovascular exam Rhythm:Regular Rate:Normal     Neuro/Psych negative neurological ROS  negative psych ROS   GI/Hepatic Neg liver ROS, Anal fissure   Endo/Other  diabetes  Renal/GU negative Renal ROS  negative genitourinary   Musculoskeletal negative musculoskeletal ROS (+)   Abdominal   Peds  Hematology negative hematology ROS (+)   Anesthesia Other Findings   Reproductive/Obstetrics negative OB ROS                           Anesthesia Physical Anesthesia Plan  ASA: 1  Anesthesia Plan: MAC   Post-op Pain Management: Toradol IV (intra-op) and Tylenol PO (pre-op)   Induction:   PONV Risk Score and Plan: 2 and Propofol infusion and TIVA  Airway Management Planned: Natural Airway and Simple Face Mask  Additional Equipment: None  Intra-op Plan:   Post-operative Plan:   Informed Consent: I have reviewed the patients History and Physical, chart, labs and discussed the procedure including the risks, benefits and alternatives for the proposed anesthesia with the patient or authorized representative who has indicated his/her understanding and acceptance.     Dental advisory given  Plan Discussed with: CRNA  Anesthesia Plan Comments:        Anesthesia Quick Evaluation

## 2021-09-10 NOTE — Interval H&P Note (Signed)
History and Physical Interval Note:  09/10/2021 12:24 PM  Mary Hebert  has presented today for surgery, with the diagnosis of anal pain.  The various methods of treatment have been discussed with the patient and family. After consideration of risks, benefits and other options for treatment, the patient has consented to  Procedure(s): ANAL EXAM UNDER ANESTHESIA (N/A) EXCISION OF SKIN TAG (N/A) POSSIBLE CHEMICAL SPHINCTEROTOMY (N/A) as a surgical intervention.  The patient's history has been reviewed, patient examined, no change in status, stable for surgery.  I have reviewed the patient's chart and labs.  Questions were answered to the patient's satisfaction.     Vanita Panda, MD  Colorectal and General Surgery [facility]

## 2021-09-10 NOTE — Op Note (Signed)
09/10/2021  1:16 PM  PATIENT:  Mary Hebert  45 y.o. female  Patient Care Team: Eulis Foster, MD as PCP - General (Family Medicine)  PRE-OPERATIVE DIAGNOSIS:  anal pain  POST-OPERATIVE DIAGNOSIS:  anal fissure, anal skin tag  PROCEDURE:  Procedure(s): ANAL EXAM UNDER ANESTHESIA EXCISION OF SKIN TAG CHEMICAL SPHINCTEROTOMY  Surgeon(s): Leighton Ruff, MD  ASSISTANT: none   ANESTHESIA:   local and MAC  SPECIMEN:  Source of Specimen:  anal skin tag  DISPOSITION OF SPECIMEN:  PATHOLOGY  COUNTS:  YES  PLAN OF CARE: Discharge to home after PACU  PATIENT DISPOSITION:  PACU - hemodynamically stable.  INDICATION: 45 y.o. F with several months of chronic anal pain.  She was diagnosed with an anal fissure and started on the appropriate medical therapy.  This did not resolve her symptoms.  She is here today for surgical treatment   OR FINDINGS: Posterior anal fissure with enlarged external skin tag, small hypertrophied anal papilla internally.  DESCRIPTION: the patient was identified in the preoperative holding area and taken to the OR where they were laid on the operating room table.  MAC anesthesia was induced without difficulty. The patient was then positioned in prone jackknife position with buttocks gently taped apart.  The patient was then prepped and draped in usual sterile fashion.  SCDs were noted to be in place prior to the initiation of anesthesia. A surgical timeout was performed indicating the correct patient, procedure, positioning and need for preoperative antibiotics.  A rectal block was performed using Marcaine with epinephrine.    I began with a digital rectal exam.  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  I then placed a Hill-Ferguson anoscope into the anal canal and evaluated this completely.  There was a large posterior midline anal fissure.  There was an overlying skin tag which I felt was inhibiting healing.  This was excised using  Metzenbaum scissors.  The edges of the incision were reapproximated using interrupted 2-0 chromic suture.  I then injected 100 units of Botox into the intersphincteric groove.  Once this was completed additional Marcaine was placed around the incision site for postoperative pain control.  The entire anal canal was inspected.  There was no obvious hemorrhoid disease.  There was no active bleeding.  A sterile dressing was placed and the patient was then awakened from anesthesia and sent to the postanesthesia care unit in stable condition.  All counts were correct per operating room staff.

## 2021-09-10 NOTE — Discharge Instructions (Addendum)
ANORECTAL SURGERY: POST OP INSTRUCTIONS Take your usually prescribed home medications unless otherwise directed. DIET: During the first few hours after surgery sip on some liquids until you are able to urinate.  It is normal to not urinate for several hours after this surgery.  If you feel uncomfortable, please contact the office for instructions.  After you are able to urinate,you may eat, if you feel like it.  Follow a light bland diet the first 24 hours after arrival home, such as soup, liquids, crackers, etc.  Be sure to include lots of fluids daily (6-8 glasses).  Avoid fast food or heavy meals, as your are more likely to get nauseated.  Eat a low fat diet the next few days after surgery.  Limit caffeine intake to 1-2 servings a day. PAIN CONTROL: Pain is best controlled by a usual combination of several different methods TOGETHER: Muscle relaxation: Soak in a warm bath (or Sitz bath) three times a day and after bowel movements.  Continue to do this until all pain is resolved. Over the counter pain medication Prescription pain medication Most patients will experience some swelling and discomfort in the anus/rectal area and incisions.  Heat such as warm towels, sitz baths, warm baths, etc to help relax tight/sore spots and speed recovery.  Some people prefer to use ice, especially in the first couple days after surgery, as it may decrease the pain and swelling, or alternate between ice & heat.  Experiment to what works for you.  Swelling and bruising can take several weeks to resolve.  Pain can take even longer to completely resolve. It is helpful to take an over-the-counter pain medication regularly for the first few weeks.  Choose one of the following that works best for you: Naproxen (Aleve, etc)  Two 220mg  tabs twice a day Ibuprofen (Advil, etc) Three 200mg  tabs four times a day (every meal & bedtime). May take ibuprofen again at 6:30pm. A  prescription for pain medication (such as percocet,  oxycodone, hydrocodone, etc) should be given to you upon discharge.  Take your pain medication as prescribed.  If you are having problems/concerns with the prescription medicine (does not control pain, nausea, vomiting, rash, itching, etc), please call [phone] to see if we need to switch you to a different pain medicine that will work better for you and/or control your side effect better. If you need a refill on your pain medication, please contact your pharmacy.  They will contact our office to request authorization. Prescriptions will not be filled after 5 pm or on week-ends. KEEP YOUR BOWELS REGULAR and AVOID CONSTIPATION The goal is one to two soft bowel movements a day.  You should at least have a bowel movement every other day. Avoid getting constipated.  Between the surgery and the pain medications, it is common to experience some constipation. This can be very painful after rectal surgery.  Increasing fluid intake and taking a fiber supplement (such as Metamucil, Citrucel, FiberCon, etc) 1-2 times a day regularly will usually help prevent this problem from occurring.  A stool softener like colace is also recommended.  This can be purchased over the counter at your pharmacy.  You can take it up to 3 times a day.  If you do not have a bowel movement after 24 hrs since your surgery, take one does of milk of magnesia.  If you still haven't had a bowel movement 8-12 hours after that dose, take another dose.  If you don't have a  bowel movement 48 hrs after surgery, purchase a Fleets enema from the drug store and administer gently per package instructions.  If you still are having trouble with your bowel movements after that, please call the office for further instructions. If you develop diarrhea or have many loose bowel movements, simplify your diet to bland foods & liquids for a few days.  Stop any stool softeners and decrease your fiber supplement.  Switching to mild anti-diarrheal medications  (Kayopectate, Pepto Bismol) can help.  If this worsens or does not improve, please call us.  Wound Care Remove your bandages before your first bowel movement or 8 hours after surgery.     Remove any wound packing material at this tim,e as well.  You do not need to repack the wound unless instructed otherwise.  Wear an absorbent pad or soft cotton gauze in your underwear to catch any drainage and help keep the area clean. You should change this every 2-3 hours while awake. Keep the area clean and dry.  Bathe / shower every day, especially after bowel movements.  Keep the area clean by showering / bathing over the incision / wound.   It is okay to soak an open wound to help wash it.  Wet wipes or showers / gentle washing after bowel movements is often less traumatic than regular toilet paper. You may have some styrofoam-like soft packing in the rectum which will come out with the first bowel movement.  You will often notice bleeding with bowel movements.  This should slow down by the end of the first week of surgery Expect some drainage.  This should slow down, too, by the end of the first week of surgery.  Wear an absorbent pad or soft cotton gauze in your underwear until the drainage stops. Do Not sit on a rubber or pillow ring.  This can make you symptoms worse.  You may sit on a soft pillow if needed.  ACTIVITIES as tolerated:   You may resume regular (light) daily activities beginning the next day--such as daily self-care, walking, climbing stairs--gradually increasing activities as tolerated.  If you can walk 30 minutes without difficulty, it is safe to try more intense activity such as jogging, treadmill, bicycling, low-impact aerobics, swimming, etc. Save the most intensive and strenuous activity for last such as sit-ups, heavy lifting, contact sports, etc  Refrain from any heavy lifting or straining until you are off narcotics for pain control.   You may drive when you are no longer taking  prescription pain medication, you can comfortably sit for long periods of time, and you can safely maneuver your car and apply brakes. You may have sexual intercourse when it is comfortable.  FOLLOW UP in our office Please call [facility] at [phone] to set up an appointment to see your surgeon in the office for a follow-up appointment approximately 3-4 weeks after your surgery. Make sure that you call for this appointment the day you arrive home to insure a convenient appointment time. 10. IF YOU HAVE DISABILITY OR FAMILY LEAVE FORMS, BRING THEM TO THE OFFICE FOR PROCESSING.  DO NOT GIVE THEM TO YOUR DOCTOR.     WHEN TO CALL us [phone]: Poor pain control Reactions / problems with new medications (rash/itching, nausea, etc)  Fever over 101.5 F (38.5 C) Inability to urinate Nausea and/or vomiting Worsening swelling or bruising Continued bleeding from incision. Increased pain, redness, or drainage from the incision  The clinic staff is available to answer your questions  during regular business hours (8:30am-5pm).  Please don't hesitate to call and ask to speak to one of our nurses for clinical concerns.   A surgeon from [facility] is always on call at the hospitals   If you have a medical emergency, go to the nearest emergency room or call 911.    [facility] [address], [place], [place]  [address] ? MAIN: [phone] ? TOLL FREE: [phone] ? FAX [phone] [email or web address]    Post Anesthesia Home Care Instructions  Activity: Get plenty of rest for the remainder of the day. A responsible individual must stay with you for 24 hours following the procedure.  For the next 24 hours, DO NOT: -Drive a car -Advertising copywriter -Drink alcoholic beverages -Take any medication unless instructed by your physician -Make any legal decisions or sign important papers.  Meals: Start with liquid foods such as  gelatin or soup. Progress to regular foods as tolerated. Avoid greasy, spicy, heavy foods. If nausea and/or vomiting occur, drink only clear liquids until the nausea and/or vomiting subsides. Call your physician if vomiting continues.  Special Instructions/Symptoms: Your throat may feel dry or sore from the anesthesia or the breathing tube placed in your throat during surgery. If this causes discomfort, gargle with warm salt water. The discomfort should disappear within 24 hours.

## 2021-09-11 ENCOUNTER — Encounter (HOSPITAL_BASED_OUTPATIENT_CLINIC_OR_DEPARTMENT_OTHER): Payer: Self-pay | Admitting: General Surgery

## 2021-09-11 LAB — SURGICAL PATHOLOGY

## 2022-03-16 ENCOUNTER — Encounter: Payer: Self-pay | Admitting: *Deleted

## 2023-05-04 ENCOUNTER — Emergency Department (HOSPITAL_COMMUNITY)
Admission: EM | Admit: 2023-05-04 | Discharge: 2023-05-05 | Disposition: A | Discharge status: Home / Self Care | Payer: BC Managed Care – PPO | Attending: Emergency Medicine | Admitting: Emergency Medicine

## 2023-05-04 ENCOUNTER — Emergency Department (HOSPITAL_COMMUNITY): Payer: BC Managed Care – PPO

## 2023-05-04 ENCOUNTER — Other Ambulatory Visit: Payer: Self-pay

## 2023-05-04 ENCOUNTER — Encounter (HOSPITAL_COMMUNITY): Payer: Self-pay | Admitting: *Deleted

## 2023-05-04 DIAGNOSIS — R509 Fever, unspecified: Secondary | ICD-10-CM | POA: Diagnosis present

## 2023-05-04 DIAGNOSIS — D72829 Elevated white blood cell count, unspecified: Secondary | ICD-10-CM | POA: Diagnosis not present

## 2023-05-04 DIAGNOSIS — Z1152 Encounter for screening for COVID-19: Secondary | ICD-10-CM | POA: Diagnosis not present

## 2023-05-04 DIAGNOSIS — J181 Lobar pneumonia, unspecified organism: Secondary | ICD-10-CM | POA: Diagnosis not present

## 2023-05-04 DIAGNOSIS — J189 Pneumonia, unspecified organism: Secondary | ICD-10-CM

## 2023-05-04 LAB — CBC WITH DIFFERENTIAL/PLATELET
Abs Immature Granulocytes: 0.06 10*3/uL (ref 0.00–0.07)
Basophils Absolute: 0 10*3/uL (ref 0.0–0.1)
Basophils Relative: 0 %
Eosinophils Absolute: 0 10*3/uL (ref 0.0–0.5)
Eosinophils Relative: 0 %
HCT: 36 % (ref 36.0–46.0)
Hemoglobin: 11.9 g/dL — ABNORMAL LOW (ref 12.0–15.0)
Immature Granulocytes: 1 %
Lymphocytes Relative: 11 %
Lymphs Abs: 1.4 10*3/uL (ref 0.7–4.0)
MCH: 30.6 pg (ref 26.0–34.0)
MCHC: 33.1 g/dL (ref 30.0–36.0)
MCV: 92.5 fL (ref 80.0–100.0)
Monocytes Absolute: 1.1 10*3/uL — ABNORMAL HIGH (ref 0.1–1.0)
Monocytes Relative: 9 %
Neutro Abs: 10 10*3/uL — ABNORMAL HIGH (ref 1.7–7.7)
Neutrophils Relative %: 79 %
Platelets: 194 10*3/uL (ref 150–400)
RBC: 3.89 MIL/uL (ref 3.87–5.11)
RDW: 15.1 % (ref 11.5–15.5)
WBC: 12.6 10*3/uL — ABNORMAL HIGH (ref 4.0–10.5)
nRBC: 0 % (ref 0.0–0.2)

## 2023-05-04 LAB — COMPREHENSIVE METABOLIC PANEL
ALT: 72 U/L — ABNORMAL HIGH (ref 0–44)
AST: 90 U/L — ABNORMAL HIGH (ref 15–41)
Albumin: 3.5 g/dL (ref 3.5–5.0)
Alkaline Phosphatase: 91 U/L (ref 38–126)
Anion gap: 11 (ref 5–15)
BUN: 17 mg/dL (ref 6–20)
CO2: 21 mmol/L — ABNORMAL LOW (ref 22–32)
Calcium: 8.6 mg/dL — ABNORMAL LOW (ref 8.9–10.3)
Chloride: 100 mmol/L (ref 98–111)
Creatinine, Ser: 0.94 mg/dL (ref 0.44–1.00)
GFR, Estimated: >60 mL/min (ref >60)
Glucose, Bld: 115 mg/dL — ABNORMAL HIGH (ref 70–99)
Potassium: 3.4 mmol/L — ABNORMAL LOW (ref 3.5–5.1)
Sodium: 132 mmol/L — ABNORMAL LOW (ref 135–145)
Total Bilirubin: 0.2 mg/dL — ABNORMAL LOW (ref 0.3–1.2)
Total Protein: 7.2 g/dL (ref 6.5–8.1)

## 2023-05-04 LAB — PROTIME-INR
INR: 1.2 (ref 0.8–1.2)
Prothrombin Time: 15.6 seconds — ABNORMAL HIGH (ref 11.4–15.2)

## 2023-05-04 LAB — HCG, SERUM, QUALITATIVE: Preg, Serum: NEGATIVE

## 2023-05-04 LAB — I-STAT CG4 LACTIC ACID, ED: Lactic Acid, Venous: 0.8 mmol/L (ref 0.5–1.9)

## 2023-05-04 MED ORDER — ACETAMINOPHEN 325 MG PO TABS
650.0000 mg | ORAL_TABLET | Freq: Once | ORAL | Status: AC
  Administered 2023-05-04: 650 mg via ORAL
  Filled 2023-05-04: qty 2

## 2023-05-04 NOTE — ED Triage Notes (Signed)
The pt has fever and body aches  for 2-3 days high temp she had a negative covid test 2-3 days ago at work and was told that she had a virus  she last had tylenol at 1620  lmp last week

## 2023-05-04 NOTE — ED Triage Notes (Signed)
Body aches back pain severe headache

## 2023-05-05 LAB — CULTURE, BLOOD (ROUTINE X 2)
Culture: NO GROWTH
Special Requests: ADEQUATE

## 2023-05-05 LAB — URINALYSIS, W/ REFLEX TO CULTURE (INFECTION SUSPECTED)
Bilirubin Urine: NEGATIVE
Glucose, UA: NEGATIVE mg/dL
Ketones, ur: NEGATIVE mg/dL
Nitrite: NEGATIVE
Protein, ur: NEGATIVE mg/dL
Specific Gravity, Urine: 1.012 (ref 1.005–1.030)
pH: 5 (ref 5.0–8.0)

## 2023-05-05 LAB — SARS CORONAVIRUS 2 BY RT PCR: SARS Coronavirus 2 by RT PCR: NEGATIVE

## 2023-05-05 MED ORDER — KETOROLAC TROMETHAMINE 15 MG/ML IJ SOLN
15.0000 mg | Freq: Once | INTRAMUSCULAR | Status: AC
  Administered 2023-05-05: 15 mg via INTRAVENOUS
  Filled 2023-05-05: qty 1

## 2023-05-05 MED ORDER — DOXYCYCLINE HYCLATE 100 MG PO TABS
100.0000 mg | ORAL_TABLET | Freq: Once | ORAL | Status: AC
  Administered 2023-05-05: 100 mg via ORAL
  Filled 2023-05-05: qty 1

## 2023-05-05 MED ORDER — SODIUM CHLORIDE 0.9 % IV BOLUS (SEPSIS)
1000.0000 mL | Freq: Once | INTRAVENOUS | Status: AC
  Administered 2023-05-05: 1000 mL via INTRAVENOUS

## 2023-05-05 MED ORDER — DOXYCYCLINE HYCLATE 100 MG PO CAPS: 100.0000 mg | ORAL_CAPSULE | Freq: Two times a day (BID) | ORAL | 0 refills | Status: AC

## 2023-05-05 NOTE — Discharge Instructions (Addendum)
Please see your primary doctor in 1 month for repeat x-ray

## 2023-05-05 NOTE — ED Provider Notes (Signed)
[facility] Provider Note   CSN: [id number] Arrival date & time: 05/04/23  2131     History  Chief Complaint  Patient presents with   Fever    Mary Hebert is a 47 y.o. female.  The history is provided by the patient.  Patient w/history of gestational diabetes presents with fever and bodyaches. Patient reports for around 3 days she has had a fever, body aches and headaches. No significant cough or sore throat.  No vomiting or diarrhea.  No rash.  No tick bites.  She traveled back home to [place] back in May for a few weeks but stayed in the city.  Denies any illnesses during that visit. She is otherwise healthy at baseline. No dysuria or abdominal pain Patient was seen recently in a clinic and was told she had negative COVID testing  Patient speaks [place] and declines interpreter   Past Medical History:  Diagnosis Date   Anal fissure 2022   Gestational diabetes mellitus    with 2nd child, did not have with children #3 and 4   Internal hemorrhoids 2022   Wears glasses    for driving only per pt    Home Medications Prior to Admission medications   Medication Sig Start Date End Date Taking? Authorizing Provider  doxycycline (VIBRAMYCIN) 100 MG capsule Take 1 capsule (100 mg total) by mouth 2 (two) times daily. 05/05/23  Yes Zadie Rhine, MD      Allergies    Patient has no known allergies.    Review of Systems   Review of Systems  Constitutional:  Positive for fatigue and fever.  Cardiovascular:  Negative for chest pain.  Gastrointestinal:  Negative for vomiting.  Genitourinary:  Negative for dysuria.  Musculoskeletal:  Positive for myalgias.  Skin:  Negative for rash.    Physical Exam Updated Vital Signs BP 101/69 (BP Location: Right Arm)   Pulse 86   Temp 98 F (36.7 C) (Oral)   Resp 18   Ht 1.651 m (5\' 5" )   Wt 77.7 kg   LMP 04/20/2023   SpO2 99%   BMI 28.51 kg/m  Physical Exam CONSTITUTIONAL:  Well developed/well nourished HEAD: Normocephalic/atraumatic EYES: EOMI/PERRL ENMT: Mucous membranes moist, uvula midline, minimal erythema, no exudates NECK: supple no meningeal signs SPINE/BACK:entire spine nontender CV: S1/S2 noted, no murmurs/rubs/gallops noted LUNGS: Lungs are clear to auscultation bilaterally, no apparent distress ABDOMEN: soft, nontender, no rebound or guarding, bowel sounds noted throughout abdomen GU:no cva tenderness NEURO: Pt is awake/alert/appropriate, moves all extremitiesx4.  No facial droop.   EXTREMITIES: pulses normal/equal, full ROM SKIN: warm, color normal PSYCH: no abnormalities of mood noted, alert and oriented to situation  ED Results / Procedures / Treatments   Labs (all labs ordered are listed, but only abnormal results are displayed) Labs Reviewed  COMPREHENSIVE METABOLIC PANEL - Abnormal; Notable for the following components:      Result Value   Sodium 132 (*)    Potassium 3.4 (*)    CO2 21 (*)    Glucose, Bld 115 (*)    Calcium 8.6 (*)    AST 90 (*)    ALT 72 (*)    Total Bilirubin 0.2 (*)    All other components within normal limits  CBC WITH DIFFERENTIAL/PLATELET - Abnormal; Notable for the following components:   WBC 12.6 (*)    Hemoglobin 11.9 (*)    Neutro Abs 10.0 (*)    Monocytes Absolute 1.1 (*)  All other components within normal limits  PROTIME-INR - Abnormal; Notable for the following components:   Prothrombin Time 15.6 (*)    All other components within normal limits  URINALYSIS, W/ REFLEX TO CULTURE (INFECTION SUSPECTED) - Abnormal; Notable for the following components:   APPearance HAZY (*)    Hgb urine dipstick MODERATE (*)    Leukocytes,Ua SMALL (*)    Bacteria, UA MANY (*)    All other components within normal limits  SARS CORONAVIRUS 2 BY RT PCR  CULTURE, BLOOD (ROUTINE X 2)  CULTURE, BLOOD (ROUTINE X 2)  HCG, SERUM, QUALITATIVE  I-STAT CG4 LACTIC ACID, ED    EKG None  Radiology DG Chest 2  View  Result Date: 05/04/2023 CLINICAL DATA:  Suspected sepsis EXAM: CHEST - 2 VIEW COMPARISON:  Radiographs 02/01/2015 FINDINGS: The heart size and mediastinal contours are within normal limits. Airspace opacity in the right lower lobe may be due to atelectasis or infiltrate. No pleural effusion or pneumothorax. No displaced rib fractures. IMPRESSION: Atelectasis or infiltrates in right lower lobe. Follow-up in 6-8 weeks is recommended to ensure resolution. Electronically Signed   By: Minerva Fester M.D.   On: 05/04/2023 22:24    Procedures Procedures    Medications Ordered in ED Medications  doxycycline (VIBRA-TABS) tablet 100 mg (has no administration in time range)  acetaminophen (TYLENOL) tablet 650 mg (650 mg Oral Given 05/04/23 2159)  sodium chloride 0.9 % bolus 1,000 mL ( Intravenous Rate/Dose Change 05/05/23 0313)  ketorolac (TORADOL) 15 MG/ML injection 15 mg (15 mg Intravenous Given 05/05/23 0237)    ED Course/ Medical Decision Making/ A&P Clinical Course as of 05/05/23 0456  Thu May 05, 2023  0253 Patient overall well-appearing.  She has had fever for about 3 days.  She is mostly having fatigue, headache and bodyaches.  Recently seen as an outpatient and had negative COVID testing.  Of note, patient was treated for a chronic cough earlier in the month w/azithromycin  Traveled to [place] over 2 months ago but no issues after that visit [DW]  0454 Overall patient is improved, vitals improved.  X-ray does show evidence of pneumonia.  Patient does report earlier in the month she had a cough and completed a course of azithromycin.  Plan to give her a round of doxycycline for 10 days.  She will need to have follow-up with her PCP in a month for recheck [DW]  0456 At this point patient is well-appearing, no acute distress, no meningeal signs.  Low suspicion for meningitis at this time  [DW]    Clinical Course User Index [DW] Zadie Rhine, MD                             Medical  Decision Making Risk Prescription drug management.   This patient presents to the ED for concern of fever, this involves an extensive number of treatment options, and is a complaint that carries with it a high risk of complications and morbidity.  The differential diagnosis includes but is not limited to meningitis, pyelonephritis, pneumonia, viral illness, COVID-19, bacteremia, tickborne illness  Comorbidities that complicate the patient evaluation: Patient's presentation is complicated by their history of gestational diabetes  Social Determinants of Health: Patient's  recent travel and [place] as a second language   increases the complexity of managing their presentation  Additional history obtained: Records reviewed Primary Care Documents  Lab Tests: I Ordered, and personally interpreted labs.  The  pertinent results include: leukocytosis  Imaging Studies ordered: I ordered imaging studies including X-ray chest   I independently visualized and interpreted imaging which showed  Pneumonia I agree with the radiologist interpretation   Medicines ordered and prescription drug management: I ordered medication including tylenol and toradol  for fever  Reevaluation of the patient after these medicines showed that the patient    improved  Reevaluation: After the interventions noted above, I reevaluated the patient and found that they have :improved  Complexity of problems addressed: Patient's presentation is most consistent with  acute presentation with potential threat to life or bodily function  Disposition: After consideration of the diagnostic results and the patient's response to treatment,  I feel that the patent would benefit from discharge   .           Final Clinical Impression(s) / ED Diagnoses Final diagnoses:  Community acquired pneumonia of right lower lobe of lung    Rx / DC Orders ED Discharge Orders          Ordered    doxycycline (VIBRAMYCIN) 100 MG  capsule  2 times daily        05/05/23 0451              Zadie Rhine, MD 05/05/23 [phone]

## 2023-05-08 LAB — CULTURE, BLOOD (ROUTINE X 2)

## 2024-09-28 ENCOUNTER — Ambulatory Visit (INDEPENDENT_AMBULATORY_CARE_PROVIDER_SITE_OTHER): Payer: Self-pay

## 2024-09-28 ENCOUNTER — Ambulatory Visit: Payer: Self-pay

## 2024-09-28 ENCOUNTER — Encounter: Payer: Self-pay | Admitting: *Deleted

## 2024-09-28 ENCOUNTER — Ambulatory Visit
Admission: EM | Admit: 2024-09-28 | Discharge: 2024-09-28 | Disposition: A | Discharge status: Home / Self Care | Payer: Self-pay | Attending: Family Medicine | Admitting: Family Medicine

## 2024-09-28 DIAGNOSIS — M25512 Pain in left shoulder: Secondary | ICD-10-CM

## 2024-09-28 DIAGNOSIS — S39012A Strain of muscle, fascia and tendon of lower back, initial encounter: Secondary | ICD-10-CM

## 2024-09-28 MED ORDER — ACETAMINOPHEN 325 MG PO TABS
650.0000 mg | ORAL_TABLET | Freq: Once | ORAL | Status: AC
  Administered 2024-09-28: 650 mg via ORAL

## 2024-09-28 MED ORDER — CYCLOBENZAPRINE HCL 10 MG PO TABS: 10.0000 mg | ORAL_TABLET | Freq: Every day | ORAL | 0 refills | Status: AC
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
